# Patient Record
Sex: Male | Born: 1960 | Race: White | Hispanic: No | Marital: Single | Smoking: Never smoker
Health system: Southern US, Community
[De-identification: ages and names within clinical notes are randomized; demographics above are authoritative.]

## PROBLEM LIST (undated history)

## (undated) ENCOUNTER — Emergency Department (HOSPITAL_BASED_OUTPATIENT_CLINIC_OR_DEPARTMENT_OTHER): Payer: BC Managed Care – PPO

## (undated) DIAGNOSIS — N200 Calculus of kidney: Secondary | ICD-10-CM

## (undated) DIAGNOSIS — E119 Type 2 diabetes mellitus without complications: Secondary | ICD-10-CM

## (undated) DIAGNOSIS — E7800 Pure hypercholesterolemia, unspecified: Secondary | ICD-10-CM

## (undated) DIAGNOSIS — I10 Essential (primary) hypertension: Secondary | ICD-10-CM

## (undated) DIAGNOSIS — N19 Unspecified kidney failure: Secondary | ICD-10-CM

## (undated) HISTORY — PX: OTHER SURGICAL HISTORY: SHX169

---

## 2014-09-28 ENCOUNTER — Encounter (HOSPITAL_COMMUNITY): Payer: Self-pay | Admitting: Emergency Medicine

## 2014-09-28 ENCOUNTER — Emergency Department (HOSPITAL_COMMUNITY)
Admission: EM | Admit: 2014-09-28 | Discharge: 2014-09-28 | Disposition: A | Discharge status: Home / Self Care | Payer: BC Managed Care – PPO | Attending: Emergency Medicine | Admitting: Emergency Medicine

## 2014-09-28 DIAGNOSIS — I10 Essential (primary) hypertension: Secondary | ICD-10-CM | POA: Diagnosis not present

## 2014-09-28 DIAGNOSIS — Z87448 Personal history of other diseases of urinary system: Secondary | ICD-10-CM | POA: Insufficient documentation

## 2014-09-28 DIAGNOSIS — R197 Diarrhea, unspecified: Secondary | ICD-10-CM | POA: Insufficient documentation

## 2014-09-28 DIAGNOSIS — R112 Nausea with vomiting, unspecified: Secondary | ICD-10-CM | POA: Diagnosis present

## 2014-09-28 DIAGNOSIS — E119 Type 2 diabetes mellitus without complications: Secondary | ICD-10-CM | POA: Insufficient documentation

## 2014-09-28 DIAGNOSIS — R109 Unspecified abdominal pain: Secondary | ICD-10-CM | POA: Insufficient documentation

## 2014-09-28 DIAGNOSIS — Z87442 Personal history of urinary calculi: Secondary | ICD-10-CM | POA: Diagnosis not present

## 2014-09-28 HISTORY — DX: Calculus of kidney: N20.0

## 2014-09-28 HISTORY — DX: Essential (primary) hypertension: I10

## 2014-09-28 HISTORY — DX: Type 2 diabetes mellitus without complications: E11.9

## 2014-09-28 HISTORY — DX: Pure hypercholesterolemia, unspecified: E78.00

## 2014-09-28 HISTORY — DX: Unspecified kidney failure: N19

## 2014-09-28 LAB — CBC WITH DIFFERENTIAL/PLATELET
BASOS ABS: 0 10*3/uL (ref 0.0–0.1)
BASOS PCT: 0 % (ref 0–1)
Eosinophils Absolute: 0 10*3/uL (ref 0.0–0.7)
Eosinophils Relative: 0 % (ref 0–5)
HEMATOCRIT: 50.4 % (ref 39.0–52.0)
Hemoglobin: 17.6 g/dL — ABNORMAL HIGH (ref 13.0–17.0)
Lymphocytes Relative: 4 % — ABNORMAL LOW (ref 12–46)
Lymphs Abs: 0.6 10*3/uL — ABNORMAL LOW (ref 0.7–4.0)
MCH: 29.4 pg (ref 26.0–34.0)
MCHC: 34.9 g/dL (ref 30.0–36.0)
MCV: 84.1 fL (ref 78.0–100.0)
MONO ABS: 1.1 10*3/uL — AB (ref 0.1–1.0)
Monocytes Relative: 8 % (ref 3–12)
NEUTROS ABS: 12.4 10*3/uL — AB (ref 1.7–7.7)
NEUTROS PCT: 88 % — AB (ref 43–77)
PLATELETS: 175 10*3/uL (ref 150–400)
RBC: 5.99 MIL/uL — ABNORMAL HIGH (ref 4.22–5.81)
RDW: 13.6 % (ref 11.5–15.5)
WBC: 14.1 10*3/uL — ABNORMAL HIGH (ref 4.0–10.5)

## 2014-09-28 LAB — COMPREHENSIVE METABOLIC PANEL
ALK PHOS: 48 U/L (ref 38–126)
ALT: 48 U/L (ref 17–63)
AST: 34 U/L (ref 15–41)
Albumin: 4.5 g/dL (ref 3.5–5.0)
Anion gap: 14 (ref 5–15)
BILIRUBIN TOTAL: 1.4 mg/dL — AB (ref 0.3–1.2)
BUN: 35 mg/dL — ABNORMAL HIGH (ref 6–20)
CHLORIDE: 101 mmol/L (ref 101–111)
CO2: 20 mmol/L — AB (ref 22–32)
CREATININE: 2.25 mg/dL — AB (ref 0.61–1.24)
Calcium: 9.2 mg/dL (ref 8.9–10.3)
GFR calc Af Amer: 37 mL/min — ABNORMAL LOW (ref >60)
GFR, EST NON AFRICAN AMERICAN: 32 mL/min — AB (ref >60)
Glucose, Bld: 189 mg/dL — ABNORMAL HIGH (ref 65–99)
Potassium: 4.6 mmol/L (ref 3.5–5.1)
Sodium: 135 mmol/L (ref 135–145)
Total Protein: 7.4 g/dL (ref 6.5–8.1)

## 2014-09-28 LAB — LIPASE, BLOOD: Lipase: 31 U/L (ref 22–51)

## 2014-09-28 MED ORDER — DICYCLOMINE HCL 20 MG PO TABS: 20.0000 mg | ORAL_TABLET | Freq: Three times a day (TID) | ORAL | Status: DC

## 2014-09-28 MED ORDER — ONDANSETRON 4 MG PO TBDP: ORAL_TABLET | ORAL | Status: DC

## 2014-09-28 MED ORDER — DICYCLOMINE HCL 10 MG PO CAPS
10.0000 mg | ORAL_CAPSULE | ORAL | Status: AC
  Administered 2014-09-28: 10 mg via ORAL
  Filled 2014-09-28: qty 1

## 2014-09-28 NOTE — ED Provider Notes (Signed)
CSN: [id number]     Arrival date & time 09/28/14  1327 History   First MD Initiated Contact with Patient 09/28/14 1332     Chief Complaint  Patient presents with  . Nausea  . Emesis  . Diarrhea     (Consider location/radiation/quality/duration/timing/severity/associated sxs/prior Treatment) Patient is a 54 y.o. male presenting with vomiting and diarrhea. The history is provided by the patient.  Emesis Severity:  Mild Duration:  12 hours Timing:  Constant Quality:  Stomach contents Progression:  Unchanged Chronicity:  New Relieved by:  Nothing Worsened by:  Nothing tried Ineffective treatments:  None tried Associated symptoms: abdominal pain and diarrhea   Associated symptoms: no headaches   Abdominal pain:    Location:  Generalized   Quality:  Cramping   Severity:  Mild Diarrhea:    Quality:  Watery   Severity:  Mild   Duration:  12 hours   Timing:  Constant Diarrhea Associated symptoms: abdominal pain and vomiting   Associated symptoms: no fever and no headaches     Past Medical History  Diagnosis Date  . Diabetes mellitus without complication   . Kidney stones   . Kidney failure   . Hypertension   . High cholesterol    Past Surgical History  Procedure Laterality Date  . Kidney stone removal     No family history on file. History  Substance Use Topics  . Smoking status: Never Smoker   . Smokeless tobacco: Not on file  . Alcohol Use: No    Review of Systems  Constitutional: Negative for fever.  HENT: Negative for drooling and rhinorrhea.   Eyes: Negative for pain.  Respiratory: Negative for cough and shortness of breath.   Cardiovascular: Negative for chest pain and leg swelling.  Gastrointestinal: Positive for nausea, vomiting, abdominal pain and diarrhea.  Genitourinary: Negative for dysuria and hematuria.  Musculoskeletal: Negative for gait problem and neck pain.  Skin: Negative for color change.  Neurological: Negative for numbness and  headaches.  Hematological: Negative for adenopathy.  Psychiatric/Behavioral: Negative for behavioral problems.  All other systems reviewed and are negative.     Allergies  Review of patient's allergies indicates no known allergies.  Home Medications   Prior to Admission medications   Not on File   BP 128/80 mmHg  Pulse 103  Temp(Src) 98.6 F (37 C)  Resp 18  SpO2 95% Physical Exam  Constitutional: He is oriented to person, place, and time. He appears well-developed and well-nourished.  HENT:  Head: Normocephalic and atraumatic.  Right Ear: External ear normal.  Left Ear: External ear normal.  Nose: Nose normal.  Mouth/Throat: Oropharynx is clear and moist. No oropharyngeal exudate.  Eyes: Conjunctivae and EOM are normal. Pupils are equal, round, and reactive to light.  Neck: Normal range of motion. Neck supple.  Cardiovascular: Regular rhythm, normal heart sounds and intact distal pulses.  Exam reveals no gallop and no friction rub.   No murmur heard. Pulmonary/Chest: Effort normal and breath sounds normal. No respiratory distress. He has no wheezes.  Abdominal: Soft. Bowel sounds are normal. He exhibits no distension. There is no tenderness. There is no rebound and no guarding.  Musculoskeletal: Normal range of motion. He exhibits no edema or tenderness.  Neurological: He is alert and oriented to person, place, and time.  Skin: Skin is warm and dry.  Psychiatric: He has a normal mood and affect. His behavior is normal.  Nursing note and vitals reviewed.   ED Course  Procedures (including  critical care time) Labs Review Labs Reviewed  CBC WITH DIFFERENTIAL/PLATELET - Abnormal; Notable for the following:    WBC 14.1 (*)    RBC 5.99 (*)    Hemoglobin 17.6 (*)    Neutrophils Relative % 88 (*)    Neutro Abs 12.4 (*)    Lymphocytes Relative 4 (*)    Lymphs Abs 0.6 (*)    Monocytes Absolute 1.1 (*)    All other components within normal limits  COMPREHENSIVE  METABOLIC PANEL - Abnormal; Notable for the following:    CO2 20 (*)    Glucose, Bld 189 (*)    BUN 35 (*)    Creatinine, Ser 2.25 (*)    Total Bilirubin 1.4 (*)    GFR calc non Af Amer 32 (*)    GFR calc Af Amer 37 (*)    All other components within normal limits  LIPASE, BLOOD    Imaging Review No results found.   EKG Interpretation None      MDM   Final diagnoses:  Nausea and vomiting, vomiting of unspecified type  Diarrhea    2:02 PM 54 y.o. male  With history of diabetes and hypertension who presents with nausea, vomiting, diarrhea, and abdominal cramping which began around 9 PM last night which was 3 hours after [place] [employer] [place]. He denies any fevers or bloody emesis or stool. Mildly tachycardic here, vital signs otherwise unremarkable. He denies any abdominal pain currently. His abdomen is soft and benign. We'll get screening lab work and treat symptomatically. Likely foodborne illness.   Cr mildly elevated. Pt notes baseline to be between 1.5-2.0 usually. 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 Pt got 1L IVF here. He is making urine and continues to appear well. Will recommend sx therapy.  I have discussed the diagnosis/risks/treatment options with the patient and believe the pt to be eligible for discharge home to follow-up with his pcp as needed. We also discussed returning to the ED immediately if new or worsening sx occur. We discussed the sx which are most concerning (e.g., worsening abd pain, fever, bloody emesis/stools) that necessitate immediate return. Medications administered to the patient during their visit and any new prescriptions provided to the patient are listed below.  Medications given during this visit Medications  dicyclomine (BENTYL) capsule 10 mg (10 mg Oral Given 09/28/14 1533)    Discharge Medication List as of 09/28/2014  3:49 PM    START taking these medications   Details  dicyclomine (BENTYL) 20 MG tablet Take 1 tablet (20  mg total) by mouth 3 (three) times daily before meals., Starting 09/28/2014, Until Discontinued, Print    ondansetron (ZOFRAN ODT) 4 MG disintegrating tablet 4mg  ODT q4 hours prn nausea/vomit, Print           Purvis SheffieldForrest Demba Nigh, MD 09/29/14 1223

## 2014-09-28 NOTE — ED Notes (Signed)
Per [facility], pt [facility] transfer. [facility], three hours later n/v/d all throughout evening and this morning. Went to UC, felt somewhat better, was given 25mg  phenergan IM. Pt has intermittent abdominal pain, intermittent leg cramps which hes had for "years". Pt states he feels weak and SOB when he gets up and walk. RA saturations 98%.

## 2018-04-14 ENCOUNTER — Encounter: Payer: Self-pay | Admitting: Internal Medicine

## 2018-05-12 ENCOUNTER — Encounter: Payer: BC Managed Care – PPO | Attending: Internal Medicine | Admitting: Registered"

## 2018-05-12 ENCOUNTER — Encounter: Payer: Self-pay | Admitting: Registered"

## 2018-05-12 DIAGNOSIS — Z713 Dietary counseling and surveillance: Secondary | ICD-10-CM | POA: Diagnosis not present

## 2018-05-12 DIAGNOSIS — E781 Pure hyperglyceridemia: Secondary | ICD-10-CM | POA: Insufficient documentation

## 2018-05-12 DIAGNOSIS — E119 Type 2 diabetes mellitus without complications: Secondary | ICD-10-CM | POA: Insufficient documentation

## 2018-05-12 DIAGNOSIS — I10 Essential (primary) hypertension: Secondary | ICD-10-CM | POA: Insufficient documentation

## 2018-05-12 NOTE — Patient Instructions (Signed)
To find out what is causing low energy you may want to have thyroid checked.  Consider stopping the iron supplement and you can have iron levels if you and your MD think it may help. Consider taking a Vitamin B12 or B complex, due to metformin and may also help with energy, Instead of taking the vitamin C you may want to try the Emergen-C drop in that would also help you get more water in. Consider trying some of the sleep tips to get more quality sleep. Aim to eat balance meals and snacks, remember to include protein when eating fruit.

## 2018-05-12 NOTE — Progress Notes (Addendum)
Diabetes Self-Management Education  Visit Type: First/Initial  Appt. Start Time: 0930 Appt. End Time: 1045  05/12/2018  [name], identified by name and date of birth, is a 57 y.o. male with a diagnosis of Diabetes: Type 2.   ASSESSMENT Patient states about a year ago he made major changes to his diet in an attempt to lose weight. Pt states he cut out saturated fats and significantly reduced carbohydrates. Pt states for ~7 weeks he ate a lot of salads with grilled chicken and lost 20 lbs. Pt reports after getting burned out on eating the same thing he stopped being so strict and gained back the weight. Pt states his A1c went down to 5.2% and TG 400 (now he states TGs are 1200 and takes fenofibrate.) Pt states he tried fish oil for elevated TG, MD said the 3-week trial course didn't make enough difference to prescribe it, but go ahead and finish his OTC supply.  Pt states he would like to feel better, have more energy, be able to focus more and feel more productive. Pt states he started taking iron because he thought it might help him have more energy.   Pt states 4 yrs ago he started in a new position and it has been very hectic and stressful and is anticipating it will continue to increase in stress as his project continues to evolve. Pt states his work is sedentary and he does not have time to exercise. 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   Pt states he doesn't feel well and his BM have been adversely effected by the low carb diet and wants to know what types of carbs he should be eating. Pt states he continues to have urgency after eating lunch. Patient states he has discussed symptoms with MD as well.  Pt states uric acids levels are high and he has history of kidney stones and he was told R kidney is at 10% function. Pt states he feels he is probably dehydrated.   Pt states he gets abdominal cramps and occasionally in the jaw as well which usually occur during the day. Pt states  he also experiences excessive sweating when.  Sleep: 6 hrs uses CPAP. Pt states he does not have energy in the morning, never been a morning person, energy increases during the day and then low again in the evening. . Energy: 5/10. caffeine at breakfast and lunch, avoids after 6 pm. Pt states when younger his peak energy time was 12-2 am Stress: 8/10 Pt reports he does not have any specific thing he does for stress management ("I need to exercise more") sometimes will play video games with nephew,which he feels helps.  Provided sample fish oil supplement TherOmega Lot [id number] Exp 09/2018  Diabetes Self-Management Education - 05/12/18 0959      Visit Information   Visit Type  First/Initial      Initial Visit   Diabetes Type  Type 2    Are you currently following a meal plan?  Yes    What type of meal plan do you follow?  low carb    Are you taking your medications as prescribed?  Yes    Date Diagnosed  10 yr ago      Health Coping   How would you rate your overall health?  Fair      Psychosocial Assessment   Patient Belief/Attitude about Diabetes  Motivated to manage diabetes    How often do you need to have  someone help you when you read instructions, pamphlets, or other written materials from your doctor or pharmacy?  1 - Never    What is the last grade level you completed in school?  masters degree      Complications   Last HgB A1C per patient/outside source  6.3 %    How often do you check your blood sugar?  1-2 times/day    Fasting Blood glucose range (mg/dL)  16-109;[id number];130-179   [id number]   Number of hypoglycemic episodes per month  0    Have you had a dilated eye exam in the past 12 months?  Yes    Have you had a dental exam in the past 12 months?  Yes    Are you checking your feet?  Yes    How many days per week are you checking your feet?  1      Dietary Intake   Breakfast  pork sausage egg cheese, unsweet tea    Snack (morning)  none    Lunch  caferteria vegetables,  fish or chicken OR burger (no bread), unsweet tea    Snack (afternoon)  none OR grapes OR leftover protein    Dinner  protein, sometimes vegetables, rarely potatoes    Snack (evening)  protein (nuts eaten daily) OR fruit    Beverage(s)  unsweet tea, caffiene free diet mtn dew, gatorade zero, water      Exercise   Exercise Type  ADL's    How many days per week to you exercise?  0    How many minutes per day do you exercise?  0    Total minutes per week of exercise  0      Patient Education   Previous Diabetes Education  No    Nutrition management   Role of diet in the treatment of diabetes and the relationship between the three main macronutrients and blood glucose level    Physical activity and exercise   Role of exercise on diabetes management, blood pressure control and cardiac health.    Psychosocial adjustment  Role of stress on diabetes    Personal strategies to promote health  Other (comment)   sleep     Individualized Goals (developed by patient)   Nutrition  General guidelines for healthy choices and portions discussed      Outcomes   Expected Outcomes  Demonstrated interest in learning. Expect positive outcomes    Future DMSE  PRN    Program Status  Completed      Individualized Plan for Diabetes Self-Management Training:   Learning Objective:  Patient will have a greater understanding of diabetes self-management. Patient education plan is to attend individual and/or group sessions per assessed needs and concerns.    Patient Instructions  To find out what is causing low energy you may want to have thyroid checked.  Consider stopping the iron supplement and you can have iron levels if you and your MD think it may help. Consider taking a Vitamin B12 or B complex, due to metformin and may also help with energy, Instead of taking the vitamin C you may want to try the Emergen-C drop in that would also help you get more water in. Consider trying some of the sleep tips to  get more quality sleep. Aim to eat balance meals and snacks, remember to include protein when eating fruit.   Expected Outcomes:  Demonstrated interest in learning. Expect positive outcomes  Education material provided: My Plate, sleep hygiene, Food Group  ideas handouts.  If problems or questions, patient to contact team via:  Phone  Future DSME appointment: PRN

## 2018-08-08 ENCOUNTER — Encounter: Payer: Self-pay | Admitting: Internal Medicine

## 2018-08-12 ENCOUNTER — Telehealth: Payer: Self-pay | Admitting: Internal Medicine

## 2018-08-12 NOTE — Telephone Encounter (Signed)
Called patient and left VM to call and schedule lipid appointment with Dr. Rennis Golden.

## 2019-01-01 ENCOUNTER — Other Ambulatory Visit: Payer: Self-pay

## 2019-01-01 ENCOUNTER — Encounter: Payer: Self-pay | Admitting: Internal Medicine

## 2019-01-01 ENCOUNTER — Ambulatory Visit (INDEPENDENT_AMBULATORY_CARE_PROVIDER_SITE_OTHER): Payer: BC Managed Care – PPO | Admitting: Internal Medicine

## 2019-01-01 VITALS — BP 150/80 | HR 84 | Temp 99.5°F | Ht 74.0 in | Wt 315.8 lb

## 2019-01-01 DIAGNOSIS — E781 Pure hyperglyceridemia: Secondary | ICD-10-CM

## 2019-01-01 DIAGNOSIS — Z8249 Family history of ischemic heart disease and other diseases of the circulatory system: Secondary | ICD-10-CM | POA: Diagnosis not present

## 2019-01-01 DIAGNOSIS — E119 Type 2 diabetes mellitus without complications: Secondary | ICD-10-CM

## 2019-01-01 DIAGNOSIS — I10 Essential (primary) hypertension: Secondary | ICD-10-CM

## 2019-01-01 MED ORDER — FENOFIBRATE MICRONIZED 134 MG PO CAPS: ORAL_CAPSULE | ORAL | 11 refills | Status: DC

## 2019-01-01 MED ORDER — VASCEPA 1 G PO CAPS: 2.0000 g | ORAL_CAPSULE | Freq: Two times a day (BID) | ORAL | 11 refills | Status: DC

## 2019-01-01 NOTE — Patient Instructions (Signed)
Medication Instructions:  START vascepa 2 gram twice daily  START fenofibrate 134mg  once daily with a meal If you need a refill on your cardiac medications before your next appointment, please call your pharmacy.   Lab work: FASTING lab work in about 3 months (prior to next visit with Dr. Debara Pickett) If you have labs (blood work) drawn today and your tests are completely normal, you will receive your results only by: Marland Kitchen MyChart Message (if you have MyChart) OR . A paper copy in the mail If you have any lab test that is abnormal or we need to change your treatment, we will call you to review the results.  Testing/Procedures: Dr. Debara Pickett has ordered a CT coronary calcium score. This test is done at [address]. This is $150 out of pocket.   Coronary CalciumScan A coronary calcium scan is an imaging test used to look for deposits of calcium and other fatty materials (plaques) in the inner lining of the blood vessels of the heart (coronary arteries). These deposits of calcium and plaques can partly clog and narrow the coronary arteries without producing any symptoms or warning signs. This puts a person at risk for a heart attack. This test can detect these deposits before symptoms develop. Tell a health care provider about:  Any allergies you have.  All medicines you are taking, including vitamins, herbs, eye drops, creams, and over-the-counter medicines.  Any problems you or family members have had with anesthetic medicines.  Any blood disorders you have.  Any surgeries you have had.  Any medical conditions you have.  Whether you are pregnant or may be pregnant. What are the risks? Generally, this is a safe procedure. However, problems may occur, including:  Harm to a pregnant woman and her unborn baby. This test involves the use of radiation. Radiation exposure can be dangerous to a pregnant woman and her unborn baby. If you are pregnant, you generally should not have  this procedure done.  Slight increase in the risk of cancer. This is because of the radiation involved in the test. What happens before the procedure? No preparation is needed for this procedure. What happens during the procedure?  You will undress and remove any jewelry around your neck or chest.  You will put on a hospital gown.  Sticky electrodes will be placed on your chest. The electrodes will be connected to an electrocardiogram (ECG) machine to record a tracing of the electrical activity of your heart.  A CT scanner will take pictures of your heart. During this time, you will be asked to lie still and hold your breath for 2-3 seconds while a picture of your heart is being taken. The procedure may vary among health care providers and hospitals. What happens after the procedure?  You can get dressed.  You can return to your normal activities.  It is up to you to get the results of your test. Ask your health care provider, or the department that is doing the test, when your results will be ready. Summary  A coronary calcium scan is an imaging test used to look for deposits of calcium and other fatty materials (plaques) in the inner lining of the blood vessels of the heart (coronary arteries).  Generally, this is a safe procedure. Tell your health care provider if you are pregnant or may be pregnant.  No preparation is needed for this procedure.  A CT scanner will take pictures of your heart.  You can return  to your normal activities after the scan is done. This information is not intended to replace advice given to you by your health care provider. Make sure you discuss any questions you have with your health care provider. Document Released: 10/27/2007 Document Revised: 03/19/2016 Document Reviewed: 03/19/2016 Elsevier Interactive Patient Education  2017 Elsevier Inc.    Follow-Up: Dr. Rennis GoldenHilty recommends that you schedule a follow up visit with him the in the LIPID CLINIC  in 3 months. Please have fasting blood work about 1 week prior to this visit and he will review the blood work results with you at your appointment.   [email or web address] co-pay card [email or web address]

## 2019-01-01 NOTE — Progress Notes (Addendum)
LIPID CLINIC CONSULT NOTE  Chief Complaint:  High triglycerides  Primary Care Physician: Alysia PennaHolwerda, Scott, Jesse Hall  Primary Hall:  No primary care provider on file.  HPI:  Jesse Hall is a 58 y.o. male who is being seen today for the evaluation of high triglycerides at the request of Alysia PennaHolwerda, Scott, Jesse Hall.  This is a pleasant 58 year old male who is referred by Dr. Link SnufferHolwerda for high triglycerides.  He has past medical history significant for diabetes however this is only in the past several years.  He has otherwise a longstanding history of high triglycerides for more than 20 years.  This complicated by obesity, hypertension, some degree of kidney dysfunction with kidney stones, OSA on CPAP, and a family history of heart disease on his mother side.  Most recently his lipid profile showed a total cholesterol 170 with triglycerides of 1001 and HDL 25.  LDL was not calculated.  Previously was taking fenofibrate 145 mg daily however has been off of it for few weeks as he says it causes him some cramping and unusual side effects.  He had also been briefly trialed on Vascepa in the past.  According to him he took it for a week and did not have significant improvement in his numbers and therefore it was stopped, however it may have just been samples.  Is not clear to me whether he had a significant amount of time on the medication to determine a benefit.  Symptomatically he reports being very fatigued.  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  When he gets some rest and sleep he seems to do better.  He denies any overt chest pain.  PMHx:  Past Medical History:  Diagnosis Date  . Diabetes mellitus without complication (HCC)   . High cholesterol   . Hypertension   . Kidney failure   . Kidney stones     Past Surgical History:  Procedure Laterality Date  . kidney stone removal      FAMHx:  Family  History  Problem Relation Age of Onset  . CAD Mother     SOCHx:   reports that he has never smoked. He does not have any smokeless tobacco history on file. He reports that he does not drink alcohol. No history on file for drug.  ALLERGIES:  No Known Allergies  ROS: Pertinent items noted in HPI and remainder of comprehensive ROS otherwise negative.  HOME MEDS: Current Outpatient Medications on File Prior to Visit  Medication Sig Dispense Refill  . allopurinol (ZYLOPRIM) 300 MG tablet Take 300 mg by mouth daily.    . Ascorbic Acid (VITAMIN C) 100 MG tablet Take 100 mg by mouth daily.    Marland Kitchen. aspirin EC 81 MG tablet Take 81 mg by mouth daily.    . Iron TABS Take 1 tablet by mouth daily.    Marland Kitchen. losartan (COZAAR) 100 MG tablet Take 100 mg by mouth daily.    . metFORMIN (GLUCOPHAGE) 500 MG tablet Take 1,000 mg by mouth 2 (two) times daily with a meal.    . Multiple Vitamin (MULTIVITAMIN WITH MINERALS) TABS tablet Take 1 tablet by mouth daily.    . ondansetron (ZOFRAN ODT) 4 MG disintegrating tablet 4mg  ODT q4 hours prn nausea/vomit 15 tablet 0  . potassium citrate (UROCIT-K) 10 MEQ (1080 MG) SR tablet Take 10 mEq by mouth 2 (two) times daily.    .Marland Kitchen  sitaGLIPtin (JANUVIA) 100 MG tablet Take 100 mg by mouth daily.     No current facility-administered medications on file prior to visit.     LABS/IMAGING: No results found for this or any previous visit (from the past 48 hour(s)). No results found.  LIPID PANEL: No results found for: CHOL, TRIG, HDL, CHOLHDL, VLDL, LDLCALC, LDLDIRECT  WEIGHTS: Wt Readings from Last 3 Encounters:  01/01/19 (!) 315 lb 12.8 oz (143.2 kg)    VITALS: BP (!) 150/80   Pulse 84   Temp 99.5 F (37.5 C) (Temporal)   Ht 6\' 2"  (1.88 m)   Wt (!) 315 lb 12.8 oz (143.2 kg)   BMI 40.55 kg/m   EXAM: General appearance: alert, no distress and morbidly obese Neck: no carotid bruit, no JVD and thyroid not enlarged, symmetric, no tenderness/mass/nodules Lungs: clear  to auscultation bilaterally Heart: regular rate and rhythm, S1, S2 normal, no murmur, click, rub or gallop Abdomen: soft, non-tender; bowel sounds normal; no masses,  no organomegaly Extremities: extremities normal, atraumatic, no cyanosis or edema Pulses: 2+ and symmetric Skin: Skin color, texture, turgor normal. No rashes or lesions Neurologic: Grossly normal Psych: Pleasant  EKG: Deferred  ASSESSMENT: 1. Primary hypertriglyceridemia 2. Morbid obesity 3. Type 2 diabetes  PLAN: 1.   Jesse Hall has primary hypertriglyceridemia with triglycerides over thousand and is at potentially increased risk for pancreatitis.  He has had some side effects on fenofibrate.  He has been off of it for a few weeks because he noted that it had caused him some cramps and when he discontinues the medicine he feels better.  He had briefly tried Vascepa however does not sound like he either had benefit or tried it long enough.  The details are not totally clear to me however I think this is a good option for him.  I like for him to reconsider starting Vascepa 2 g twice daily.  In addition we will switch him to micronized fenofibrate 134 mg daily which may be better tolerated and is advised to take it with food.  We will plan to repeat lipids in about 3 to 4 months and follow-up afterwards.  In addition we will obtain a direct LDL.  If he is above target he would likely benefit from addition of a statin as well.  Thanks again for the kind referral.  Jesse Casino, Jesse Hall, Jesse Hall, Jesse Hall  Direct Dial: [phone]  Fax: [phone]  Website:  [email or web address]  Jesse Hall 01/01/2019, 1:33 PM

## 2019-01-30 ENCOUNTER — Other Ambulatory Visit: Payer: Self-pay

## 2019-01-30 ENCOUNTER — Ambulatory Visit (INDEPENDENT_AMBULATORY_CARE_PROVIDER_SITE_OTHER)
Admission: RE | Admit: 2019-01-30 | Discharge: 2019-01-30 | Disposition: A | Discharge status: Home / Self Care | Payer: BC Managed Care – PPO | Source: Ambulatory Visit | Attending: Internal Medicine | Admitting: Internal Medicine

## 2019-01-30 DIAGNOSIS — Z8249 Family history of ischemic heart disease and other diseases of the circulatory system: Secondary | ICD-10-CM

## 2019-01-30 DIAGNOSIS — E781 Pure hyperglyceridemia: Secondary | ICD-10-CM

## 2019-01-30 DIAGNOSIS — I10 Essential (primary) hypertension: Secondary | ICD-10-CM

## 2019-02-06 ENCOUNTER — Other Ambulatory Visit: Payer: Self-pay | Admitting: Internal Medicine

## 2019-02-06 MED ORDER — FENOFIBRATE MICRONIZED 134 MG PO CAPS: ORAL_CAPSULE | ORAL | 3 refills | Status: DC

## 2019-02-06 MED ORDER — VASCEPA 1 G PO CAPS: 2.0000 g | ORAL_CAPSULE | Freq: Two times a day (BID) | ORAL | 3 refills | Status: DC

## 2019-02-09 ENCOUNTER — Telehealth: Payer: Self-pay

## 2019-02-09 NOTE — Telephone Encounter (Signed)
Prior Auth started for Vascepa 1 GM Cap

## 2019-02-09 NOTE — Telephone Encounter (Signed)
[facility]  received a request from your provider for coverage of Vascepa 1GM OR CAPS. As long as you remain covered by the [facility] and there are no changes to your plan benefits, this request is approved for the following time period: 02/09/2019 - 02/08/2022

## 2019-02-11 ENCOUNTER — Telehealth: Payer: Self-pay | Admitting: Internal Medicine

## 2019-02-11 NOTE — Telephone Encounter (Signed)
Discussed Fenofibrate with patient and per dictation Dr Debara Pickett was aware of his intolerance previously. Patient will start as suggested by Dr Debara Pickett

## 2019-02-11 NOTE — Telephone Encounter (Signed)
New Message  Pt c/o medication issue:  1. Name of Medication: fenofibrate micronized (LOFIBRA) 134 MG capsule  2. How are you currently taking this medication (dosage and times per day)? Take 1 capsule by mouth once daily with a meal  3. Are you having a reaction (difficulty breathing--STAT)? No  4. What is your medication issue? Patient would like to clarify whether he was supposed to have refills for this medication called in or whether he was supposed to be taking a different medication. Please give patient a call back to confirm.

## 2019-03-20 ENCOUNTER — Telehealth: Payer: Self-pay | Admitting: Internal Medicine

## 2019-03-20 NOTE — Telephone Encounter (Signed)
LM with appt & lab reminder  OV 03/25/2019 @ 0830

## 2019-03-24 LAB — LIPID PANEL
Chol/HDL Ratio: 6.1 ratio — ABNORMAL HIGH (ref 0.0–5.0)
Cholesterol, Total: 164 mg/dL (ref 100–199)
HDL: 27 mg/dL — ABNORMAL LOW (ref >39)
LDL Chol Calc (NIH): 60 mg/dL (ref 0–99)
Triglycerides: 505 mg/dL — ABNORMAL HIGH (ref 0–149)
VLDL Cholesterol Cal: 77 mg/dL — ABNORMAL HIGH (ref 5–40)

## 2019-03-24 LAB — LDL CHOLESTEROL, DIRECT: LDL Direct: 58 mg/dL (ref 0–99)

## 2019-03-25 ENCOUNTER — Ambulatory Visit (INDEPENDENT_AMBULATORY_CARE_PROVIDER_SITE_OTHER): Payer: BC Managed Care – PPO | Admitting: Internal Medicine

## 2019-03-25 ENCOUNTER — Encounter: Payer: Self-pay | Admitting: Internal Medicine

## 2019-03-25 ENCOUNTER — Other Ambulatory Visit: Payer: Self-pay

## 2019-03-25 VITALS — BP 145/75 | HR 82 | Ht 74.0 in | Wt 312.8 lb

## 2019-03-25 DIAGNOSIS — E119 Type 2 diabetes mellitus without complications: Secondary | ICD-10-CM

## 2019-03-25 DIAGNOSIS — Z8249 Family history of ischemic heart disease and other diseases of the circulatory system: Secondary | ICD-10-CM | POA: Diagnosis not present

## 2019-03-25 DIAGNOSIS — R931 Abnormal findings on diagnostic imaging of heart and coronary circulation: Secondary | ICD-10-CM | POA: Diagnosis not present

## 2019-03-25 DIAGNOSIS — E781 Pure hyperglyceridemia: Secondary | ICD-10-CM

## 2019-03-25 NOTE — Progress Notes (Signed)
LIPID CLINIC CONSULT NOTE  Chief Complaint:  Follow-up elevated triglycerides  Primary Care Physician: Alysia PennaHolwerda, Scott, MD  Primary Cardiologist:  No primary care provider on file.  HPI:  Jesse Hall is a 58 y.o. male who is being seen today for the evaluation of high triglycerides at the request of Alysia PennaHolwerda, Scott, MD.  This is a pleasant 58 year old male who is referred by Dr. Link SnufferHolwerda for high triglycerides.  He has past medical history significant for diabetes however this is only in the past several years.  He has otherwise a longstanding history of high triglycerides for more than 20 years.  This complicated by obesity, hypertension, some degree of kidney dysfunction with kidney stones, OSA on CPAP, and a family history of heart disease on his mother side.  Most recently his lipid profile showed a total cholesterol 170 with triglycerides of 1001 and HDL 25.  LDL was not calculated.  Previously was taking fenofibrate 145 mg daily however has been off of it for few weeks as he says it causes him some cramping and unusual side effects.  He had also been briefly trialed on Vascepa in the past.  According to him he took it for a week and did not have significant improvement in his numbers and therefore it was stopped, however it may have just been samples.  Is not clear to me whether he had a significant amount of time on the medication to determine a benefit.  Symptomatically he reports being very fatigued.  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  When he gets some rest and sleep he seems to do better.  He denies any overt chest pain.  03/25/2019  Jesse Hall returns today for follow-up of his elevated triglycerides.  I am pleased to report a significant improvement in his numbers.  His total cholesterol today is now 164, triglycerides are 505 (reduced from 1001), HDL 27 and LDL is 60.  He did undergo  coronary artery calcium scoring which was 100, this is 75th percentile based on age and sex matched controls.  Aggressive therapy is recommended given his history of diabetes as well.  His target LDL is less than 70 which she is achieved and he has had significant reduction in his triglycerides more than 50% after adjusting his medications.  He seems to be tolerating them well additionally.  He started to exercise a little more regularly walking at least a couple miles at a fast pace several days a week.  He does report he continues to be very stressed at his job.  PMHx:  Past Medical History:  Diagnosis Date  . Diabetes mellitus without complication (HCC)   . High cholesterol   . Hypertension   . Kidney failure   . Kidney stones     Past Surgical History:  Procedure Laterality Date  . kidney stone removal      FAMHx:  Family History  Problem Relation Age of Onset  . CAD Mother     SOCHx:   reports that he has never smoked. He has never used smokeless tobacco. He reports that he does not drink alcohol. No history on file for drug.  ALLERGIES:  No Known Allergies  ROS: Pertinent items noted in HPI and remainder of comprehensive ROS otherwise negative.  HOME MEDS: Current Outpatient Medications on File Prior to Visit  Medication Sig Dispense Refill  . allopurinol (  ZYLOPRIM) 300 MG tablet Take 300 mg by mouth daily.    . Ascorbic Acid (VITAMIN C) 100 MG tablet Take 100 mg by mouth daily.    Marland Kitchen aspirin EC 81 MG tablet Take 81 mg by mouth daily.    . fenofibrate micronized (LOFIBRA) 134 MG capsule Take 1 capsule by mouth once daily with a meal 90 capsule 3  . Icosapent Ethyl (VASCEPA) 1 g CAPS Take 2 capsules (2 g total) by mouth 2 (two) times daily. 360 capsule 3  . Iron TABS Take 1 tablet by mouth daily.    Marland Kitchen losartan (COZAAR) 100 MG tablet Take 100 mg by mouth daily.    . metFORMIN (GLUCOPHAGE) 500 MG tablet Take 1,000 mg by mouth 2 (two) times daily with a meal.    . Multiple  Vitamin (MULTIVITAMIN WITH MINERALS) TABS tablet Take 1 tablet by mouth daily.    . potassium citrate (UROCIT-K) 10 MEQ (1080 MG) SR tablet Take 10 mEq by mouth 2 (two) times daily.    . sitaGLIPtin (JANUVIA) 100 MG tablet Take 100 mg by mouth daily.     No current facility-administered medications on file prior to visit.     LABS/IMAGING: Results for orders placed or performed in visit on 01/01/19 (from the past 48 hour(s))  Lipid panel     Status: Abnormal   Collection Time: 03/24/19  9:08 AM  Result Value Ref Range   Cholesterol, Total 164 100 - 199 mg/dL   Triglycerides 973 (H) 0 - 149 mg/dL   HDL 27 (L) >53 mg/dL   VLDL Cholesterol Cal 77 (H) 5 - 40 mg/dL   LDL Chol Calc (NIH) 60 0 - 99 mg/dL   Chol/HDL Ratio 6.1 (H) 0.0 - 5.0 ratio    Comment:                                   T. Chol/HDL Ratio                                             Men  Women                               1/2 Avg.Risk  3.4    3.3                                   Avg.Risk  5.0    4.4                                2X Avg.Risk  9.6    7.1                                3X Avg.Risk 23.4   11.0   LDL cholesterol, direct     Status: None   Collection Time: 03/24/19  9:08 AM  Result Value Ref Range   LDL Direct 58 0 - 99 mg/dL   No results found.  LIPID PANEL:    Component Value Date/Time   CHOL 164 03/24/2019 0908   TRIG  505 (H) 03/24/2019 0908   HDL 27 (L) 03/24/2019 0908   CHOLHDL 6.1 (H) 03/24/2019 0908   LDLCALC 60 03/24/2019 0908   LDLDIRECT 58 03/24/2019 0908    WEIGHTS: Wt Readings from Last 3 Encounters:  03/25/19 (!) 312 lb 12.8 oz (141.9 kg)  01/01/19 (!) 315 lb 12.8 oz (143.2 kg)    VITALS: BP (!) 145/75   Pulse 82   Ht 6\' 2"  (1.88 m)   Wt (!) 312 lb 12.8 oz (141.9 kg)   SpO2 94%   BMI 40.16 kg/m   EXAM: Deferred  EKG: Deferred  ASSESSMENT: 1. Primary hypertriglyceridemia 2. Elevated CAC score of 100 3. Morbid obesity 4. Type 2 diabetes   PLAN: 1.   Jesse Hall  has had significant improvement in his triglycerides.  He does have some evidence of early onset coronary disease with an elevated calcium score 100 (75th percentile for age and sex matched controls).  Aggressive therapy of his diabetes is well as recommended.  He continues to work on weight loss and more exercise.  No changes made to his therapy today.  We will plan follow-up in 6 months or sooner as necessary.  Pixie Casino, MD, [facility], [place] Director of the Advanced Lipid Disorders &  Cardiovascular Risk Reduction Clinic Diplomate of the American Board of Clinical Lipidology Attending Cardiologist  Direct Dial: [phone]  Fax: [phone]  Website:  [email or web address] Celestino Ackerman 03/25/2019, 1:03 PM

## 2019-03-25 NOTE — Patient Instructions (Addendum)
Medication Instructions:  Your physician recommends that you continue on your current medications as directed. Please refer to the Current Medication list given to you today.  *If you need a refill on your cardiac medications before your next appointment, please call your pharmacy*  Lab Work: FASTING lab work before next appointment  If you have labs (blood work) drawn today and your tests are completely normal, you will receive your results only by: Marland Kitchen MyChart Message (if you have MyChart) OR . A paper copy in the mail If you have any lab test that is abnormal or we need to change your treatment, we will call you to review the results.  Testing/Procedures: NONE  Follow-Up:  Dr. Debara Pickett recommends that you schedule a follow up visit with him the in the [place] in 6 months. Please have fasting blood work about 1 week prior to this visit and he will review the blood work results with you at your appointment.

## 2019-07-06 ENCOUNTER — Ambulatory Visit: Payer: BC Managed Care – PPO | Attending: Family

## 2019-07-06 DIAGNOSIS — Z23 Encounter for immunization: Secondary | ICD-10-CM | POA: Insufficient documentation

## 2019-07-06 NOTE — Progress Notes (Signed)
   Covid-19 Vaccination Clinic  Name:  Jesse Hall    MRN: [id number] DOB: 1960-08-13  07/06/2019  Jesse Hall was observed post Covid-19 immunization for 15 minutes without incidence. He was provided with Vaccine Information Sheet and instruction to access the V-Safe system.   Jesse Hall was instructed to call 911 with any severe reactions post vaccine: Marland Kitchen Difficulty breathing  . Swelling of your face and throat  . A fast heartbeat  . A bad rash all over your body  . Dizziness and weakness    Immunizations Administered    Name Date Dose VIS Date Route   Moderna COVID-19 Vaccine 07/06/2019 12:01 PM 0.5 mL 04/14/2019 Intramuscular   Manufacturer: Moderna   Lot: [id number]   NDC: 29021-115-52

## 2019-08-04 ENCOUNTER — Ambulatory Visit: Payer: BC Managed Care – PPO | Attending: Family

## 2019-08-04 DIAGNOSIS — Z23 Encounter for immunization: Secondary | ICD-10-CM

## 2019-08-04 NOTE — Progress Notes (Signed)
   Covid-19 Vaccination Clinic  Name:  Jesse Hall    MRN: [id number] DOB: [birth year]  08/04/2019  [name] was observed post Covid-19 immunization for 15 minutes without incident. He was provided with Vaccine Information Sheet and instruction to access the V-Safe system.   [name] was instructed to call 911 with any severe reactions post vaccine: Marland Kitchen Difficulty breathing  . Swelling of face and throat  . A fast heartbeat  . A bad rash all over body  . Dizziness and weakness   Immunizations Administered    Name Date Dose VIS Date Route   Moderna COVID-19 Vaccine 08/04/2019 11:33 AM 0.5 mL 04/14/2019 Intramuscular   Manufacturer: Moderna   Lot: [id number]   NDC: 61470-929-57

## 2019-09-30 ENCOUNTER — Telehealth: Payer: BC Managed Care – PPO | Admitting: Physician Assistant

## 2019-11-30 LAB — LIPID PANEL
Chol/HDL Ratio: 8 ratio — ABNORMAL HIGH (ref 0.0–5.0)
Cholesterol, Total: 177 mg/dL (ref 100–199)
HDL: 22 mg/dL — ABNORMAL LOW (ref >39)
Triglycerides: 998 mg/dL (ref 0–149)

## 2019-11-30 LAB — LDL CHOLESTEROL, DIRECT: LDL Direct: 38 mg/dL (ref 0–99)

## 2019-12-02 ENCOUNTER — Ambulatory Visit: Payer: BC Managed Care – PPO | Admitting: Internal Medicine

## 2019-12-02 ENCOUNTER — Other Ambulatory Visit: Payer: Self-pay

## 2019-12-02 ENCOUNTER — Encounter: Payer: Self-pay | Admitting: Internal Medicine

## 2019-12-02 VITALS — BP 120/64 | HR 83 | Ht 74.0 in | Wt 302.4 lb

## 2019-12-02 DIAGNOSIS — E781 Pure hyperglyceridemia: Secondary | ICD-10-CM | POA: Diagnosis not present

## 2019-12-02 DIAGNOSIS — E119 Type 2 diabetes mellitus without complications: Secondary | ICD-10-CM | POA: Diagnosis not present

## 2019-12-02 DIAGNOSIS — R931 Abnormal findings on diagnostic imaging of heart and coronary circulation: Secondary | ICD-10-CM

## 2019-12-02 DIAGNOSIS — Z8249 Family history of ischemic heart disease and other diseases of the circulatory system: Secondary | ICD-10-CM

## 2019-12-02 NOTE — Patient Instructions (Signed)
Medication Instructions:  Your physician recommends that you continue on your current medications as directed. Please refer to the Current Medication list given to you today.  *If you need a refill on your cardiac medications before your next appointment, please call your pharmacy*   Lab Work: FASTING lipid panel in 6 months (before next appointment with Dr. Rennis Golden)  If you have labs (blood work) drawn today and your tests are completely normal, you will receive your results only by: Marland Kitchen MyChart Message (if you have MyChart) OR . A paper copy in the mail If you have any lab test that is abnormal or we need to change your treatment, we will call you to review the results.   Testing/Procedures: NONE   Follow-Up: At [facility], you and your health needs are our priority.  As part of our continuing mission to provide you with exceptional heart care, we have created designated Provider Care Teams.  These Care Teams include your primary Cardiologist (physician) and Advanced Practice Providers (APPs -  Physician Assistants and Nurse Practitioners) who all work together to provide you with the care you need, when you need it.  We recommend signing up for the patient portal called "MyChart".  Sign up information is provided on this After Visit Summary.  MyChart is used to connect with patients for Virtual Visits (Telemedicine).  Patients are able to view lab/test results, encounter notes, upcoming appointments, etc.  Non-urgent messages can be sent to your provider as well.   To learn more about what you can do with MyChart, go to [email or web address].    Your next appointment:   6 month(s) - lipid clinic  The format for your next appointment:   In Person  Provider:   K. Italy Hilty, MD   Other Instructions    Beta Sitosterolemia

## 2019-12-02 NOTE — Progress Notes (Signed)
LIPID CLINIC CONSULT NOTE  Chief Complaint:  Follow-up elevated triglycerides  Primary Care Physician: Jesse Penna, MD  Primary Cardiologist:  No primary care provider on file.  HPI:  Jesse Hall is a 59 y.o. male who is being seen today for the evaluation of high triglycerides at the request of Jesse Penna, MD.  This is a pleasant 59 year old male who is referred by Dr. Link Hall for high triglycerides.  He has past medical history significant for diabetes however this is only in the past several years.  He has otherwise a longstanding history of high triglycerides for more than 20 years.  This complicated by obesity, hypertension, some degree of kidney dysfunction with kidney stones, OSA on CPAP, and a family history of heart disease on his mother side.  Most recently his lipid profile showed a total cholesterol 170 with triglycerides of 1001 and HDL 25.  LDL was not calculated.  Previously was taking fenofibrate 145 mg daily however has been off of it for few weeks as he says it causes him some cramping and unusual side effects.  He had also been briefly trialed on Vascepa in the past.  According to him he took it for a week and did not have significant improvement in his numbers and therefore it was stopped, however it may have just been samples.  Is not clear to me whether he had a significant amount of time on the medication to determine a benefit.  Symptomatically he reports being very fatigued.  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  When he gets some rest and sleep he seems to do better.  He denies any overt chest pain.  03/25/2019  Jesse Hall returns today for follow-up of his elevated triglycerides.  I am pleased to report a significant improvement in his numbers.  His total cholesterol today is now 164, triglycerides are 505 (reduced from 1001), HDL 27 and LDL is 60.  He did undergo  coronary artery calcium scoring which was 100, this is 75th percentile based on age and sex matched controls.  Aggressive therapy is recommended given his history of diabetes as well.  His target LDL is less than 70 which she is achieved and he has had significant reduction in his triglycerides more than 50% after adjusting his medications.  He seems to be tolerating them well additionally.  He started to exercise a little more regularly walking at least a couple miles at a fast pace several days a week.  He does report he continues to be very stressed at his job.  12/02/2019  Jesse Hall is seen today in follow-up.  He had an initial excellent response to Vascepa and fenofibrate with improvement in triglycerides down to 505 however recent repeat lab work shows an increase in triglycerides now up to almost 1000 (998).  He says he is actually change his diet more significantly, reducing carbohydrates and lost 7 pounds.  Daily fasting blood sugars are better as well.  PMHx:  Past Medical History:  Diagnosis Date  . Diabetes mellitus without complication (HCC)   . High cholesterol   . Hypertension   . Kidney failure   . Kidney stones     Past Surgical History:  Procedure Laterality Date  . kidney stone removal      FAMHx:  Family History  Problem Relation Age of Onset  . CAD Mother  SOCHx:   reports that he has never smoked. He has never used smokeless tobacco. He reports that he does not drink alcohol. No history on file for drug use.  ALLERGIES:  No Known Allergies  ROS: Pertinent items noted in HPI and remainder of comprehensive ROS otherwise negative.  HOME MEDS: Current Outpatient Medications on File Prior to Visit  Medication Sig Dispense Refill  . allopurinol (ZYLOPRIM) 300 MG tablet Take 300 mg by mouth daily.    . Ascorbic Acid (VITAMIN C) 100 MG tablet Take 100 mg by mouth daily.    . fenofibrate micronized (LOFIBRA) 134 MG capsule Take 1 capsule by mouth once daily  with a meal 90 capsule 3  . Icosapent Ethyl (VASCEPA) 1 g CAPS Take 2 capsules (2 g total) by mouth 2 (two) times daily. 360 capsule 3  . Iron TABS Take 1 tablet by mouth daily.    Marland Kitchen losartan (COZAAR) 100 MG tablet Take 100 mg by mouth daily.    . metFORMIN (GLUCOPHAGE) 500 MG tablet Take 1,000 mg by mouth 2 (two) times daily with a meal.    . potassium citrate (UROCIT-K) 10 MEQ (1080 MG) SR tablet Take 10 mEq by mouth 2 (two) times daily.    . sitaGLIPtin (JANUVIA) 100 MG tablet Take 100 mg by mouth daily.    Marland Kitchen aspirin EC 81 MG tablet Take 81 mg by mouth daily. (Patient not taking: Reported on 12/02/2019)     No current facility-administered medications on file prior to visit.    LABS/IMAGING: No results found for this or any previous visit (from the past 48 hour(s)). No results found.  LIPID PANEL:    Component Value Date/Time   CHOL 177 11/30/2019 0819   TRIG 998 (HH) 11/30/2019 0819   HDL 22 (L) 11/30/2019 0819   CHOLHDL 8.0 (H) 11/30/2019 0819   LDLCALC Comment (A) 11/30/2019 0819   LDLDIRECT 38 11/30/2019 0819    WEIGHTS: Wt Readings from Last 3 Encounters:  12/02/19 (!) 302 lb 6.4 oz (137.2 kg)  03/25/19 (!) 312 lb 12.8 oz (141.9 kg)  01/01/19 (!) 315 lb 12.8 oz (143.2 kg)    VITALS: BP 120/64   Pulse 83   Ht 6\' 2"  (1.88 m)   Wt (!) 302 lb 6.4 oz (137.2 kg)   SpO2 98%   BMI 38.83 kg/m   EXAM: Deferred  EKG: Deferred  ASSESSMENT: 1. Primary hypertriglyceridemia 2. Elevated CAC score of 100 3. Morbid obesity 4. Type 2 diabetes   PLAN: 1.   Jesse Hall had a recent increase in triglycerides from the 500s up to 998, but not any notable dietary changes.  He may be less active but has lost 7 pounds.  I will pass on his name for possible inclusion in the follow-up study for the apoC3 inhibitor trial. We discussed the very low likelihood of the rare disorder b-sitosterolemia - this would require send-out testing and is not commonly performed. Since he had some  improvement in his numbers with therapy, I think this is not likely the diagnosis.   Follow-up with repeat lipids in 6 months.  Jesse Rosenthal, MD, [facility], FACP  [place]  [facility]  Medical Director of the Advanced Lipid Disorders &  Cardiovascular Risk Reduction Clinic Diplomate of the American Board of Clinical Lipidology Attending Cardiologist  Direct Dial: [phone]  Fax: [phone]  Website:  [email or web address] Stephani Janak 12/02/2019, 9:14 AM

## 2019-12-09 ENCOUNTER — Telehealth: Payer: Self-pay | Admitting: Internal Medicine

## 2019-12-09 NOTE — Telephone Encounter (Signed)
Follow UP:    Pt said Dr [facility] nurse was supposed to be checking on some type of tet for him. He wanted to know if she found out anything about this test,

## 2019-12-09 NOTE — Telephone Encounter (Signed)
Patient called and made aware there are no general screening labs thru [facility] or [facility] for the condition beta sitosterolemia that Dr. Rennis Golden mentioned at last office visit. Advised patient that I can check with our genetics doctor to see if this is something she can screen for. Message has been sent to Dr. Jomarie Longs

## 2020-01-22 ENCOUNTER — Telehealth: Payer: Self-pay | Admitting: Hematology and Oncology

## 2020-01-22 NOTE — Telephone Encounter (Signed)
Received a new hem referral from Dr. Link Snuffer for hemochromatosis. [name] has been cld and scheduled to see Dr. Pamelia Hoit on 9/28 at 8:15am. Pt aware to arrive 30 minutes early.

## 2020-01-26 ENCOUNTER — Encounter: Payer: Self-pay | Admitting: Neurology

## 2020-01-27 ENCOUNTER — Encounter: Payer: Self-pay | Admitting: Neurology

## 2020-01-27 ENCOUNTER — Ambulatory Visit: Payer: BC Managed Care – PPO | Admitting: Neurology

## 2020-01-27 ENCOUNTER — Other Ambulatory Visit: Payer: Self-pay

## 2020-01-27 VITALS — BP 132/75 | HR 83 | Ht 74.0 in | Wt 300.0 lb

## 2020-01-27 DIAGNOSIS — Z9989 Dependence on other enabling machines and devices: Secondary | ICD-10-CM | POA: Diagnosis not present

## 2020-01-27 DIAGNOSIS — G4733 Obstructive sleep apnea (adult) (pediatric): Secondary | ICD-10-CM | POA: Diagnosis not present

## 2020-01-27 DIAGNOSIS — G4719 Other hypersomnia: Secondary | ICD-10-CM

## 2020-01-27 DIAGNOSIS — E669 Obesity, unspecified: Secondary | ICD-10-CM | POA: Diagnosis not present

## 2020-01-27 NOTE — Patient Instructions (Signed)
  Based on your symptoms and your exam I believe you are still at risk for obstructive sleep apnea and would benefit from reevaluation as it has been many years and you need new supplies and an updated machine. Therefore, I think we should proceed with a sleep study to determine how severe your sleep apnea is.  As discussed, we will proceed with a home sleep test for reevaluation purposes.   Please remember, the risks and ramifications of moderate to severe obstructive sleep apnea or OSA are: Cardiovascular disease, including congestive heart failure, stroke, difficult to control hypertension, arrhythmias, and even type 2 diabetes has been linked to untreated OSA. Sleep apnea causes disruption of sleep and sleep deprivation in most cases, which, in turn, can cause recurrent headaches, problems with memory, mood, concentration, focus, and vigilance. Most people with untreated sleep apnea report excessive daytime sleepiness, which can affect their ability to drive. Please do not drive if you feel sleepy.   I plan to see you back after your sleep study to go over the test results and where to go from there. We will call you after your sleep study to advise about the results (most likely, you will hear from [facility], my nurse) and to set up an appointment at the time, as necessary.    Our sleep lab administrative assistant will call you to schedule your home sleep test equipment pickup. If you don't hear back from her by about 2 weeks from now, please feel free to call her at [phone]. You can leave a message with your phone number and concerns, if you get the voicemail box. She will call back as soon as possible.

## 2020-01-27 NOTE — Progress Notes (Signed)
Subjective:    Patient ID: Jesse Hall is a 59 y.o. male.  HPI     Jesse Foley, MD, PhD [facility] [address] [address] [place], [place] [address]  Dear Dr. Link Snuffer,   I saw your patient, Jesse Hall, upon your kind request, in my sleep clinic today for Initial consultation of his sleep disorder, in particular, evaluation of his prior diagnosis of obstructive sleep apnea.  The patient is unaccompanied today.  As you know, Jesse Hall is a 59 year old right-handed gentleman with an underlying medical history of hyperlipidemia, gout, diabetes, back pain, obesity, who was diagnosed with obstructive sleep apnea several years ago.  Prior sleep study results are not available for my review today.  He has a CPAP machine which is set at 12 cm, his machine is about 58 years old. I was able to review CPAP compliance data from his CPAP machine which he used until August 20.  His average usage was 6 hours and 51 minutes, average AHI 1/h, leak on the higher side with a 95th percentile at 17.2 L/min, pressure of 12 cm with EPR of 2.  Since August he started using another machine which is an AutoPap which he got from a friend.  Settings are not known and compliance data not available.  He reports that he felt better after starting the AutoPap machine. His Epworth sleepiness score is 12 out of 24, fatigue severity score is 54 out of 63.  He is a non-smoker and does not drink alcohol and does not drink caffeine daily, typically 1 glass of tea about twice a week.  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  Has to go back to the office more now.  His nephew and nephew's wife live with him.  He is single, no children, no pets in the house.  His original sleep apnea diagnosis was about 18 years ago and his current machine is his second machine.  When he started using his friend's machine he felt a noticeable improvement in his daytime  energy.  He uses a nasal mask and his DME company is [employer].  He denies any night to night nocturia or recurrent morning headaches.  He suspects that he has a family history of sleep apnea but no one has been officially tested.  His Past Medical History Is Significant For: Past Medical History:  Diagnosis Date  . Diabetes mellitus without complication (HCC)   . High cholesterol   . Hypertension   . Kidney failure   . Kidney stones     His Past Surgical History Is Significant For: Past Surgical History:  Procedure Laterality Date  . kidney stone removal      His Family History Is Significant For: Family History  Problem Relation Age of Onset  . CAD Mother     His Social History Is Significant For: Social History   Socioeconomic History  . Marital status: Single    Spouse name: Not on file  . Number of children: Not on file  . Years of education: Not on file  . Highest education level: Not on file  Occupational History  . Not on file  Tobacco Use  . Smoking status: Never Smoker  . Smokeless tobacco: Never Used  Substance and Sexual Activity  . Alcohol use: No  . Drug use: Not on file  . Sexual activity: Not on file  Other Topics Concern  .  Not on file  Social History Narrative  . Not on file   Social Determinants of Health   Financial Resource Strain:   . Difficulty of Paying Living Expenses: Not on file  Food Insecurity:   . Worried About Programme researcher, broadcasting/film/video in the Last Year: Not on file  . Ran Out of Food in the Last Year: Not on file  Transportation Needs:   . Lack of Transportation (Medical): Not on file  . Lack of Transportation (Non-Medical): Not on file  Physical Activity:   . Days of Exercise per Week: Not on file  . Minutes of Exercise per Session: Not on file  Stress:   . Feeling of Stress : Not on file  Social Connections:   . Frequency of Communication with Friends and Family: Not on file  . Frequency of Social Gatherings with Friends  and Family: Not on file  . Attends Religious Services: Not on file  . Active Member of Clubs or Organizations: Not on file  . Attends Banker Meetings: Not on file  . Marital Status: Not on file    His Allergies Are:  No Known Allergies:   His Current Medications Are:  Outpatient Encounter Medications as of 01/27/2020  Medication Sig  . allopurinol (ZYLOPRIM) 300 MG tablet Take 300 mg by mouth daily.  Marland Kitchen b complex vitamins tablet Take 1 tablet by mouth daily.  . fenofibrate micronized (LOFIBRA) 134 MG capsule Take 1 capsule by mouth once daily with a meal  . Icosapent Ethyl (VASCEPA) 1 g CAPS Take 2 capsules (2 g total) by mouth 2 (two) times daily.  Marland Kitchen losartan (COZAAR) 100 MG tablet Take 100 mg by mouth daily.  . metFORMIN (GLUCOPHAGE) 500 MG tablet Take 1,000 mg by mouth 2 (two) times daily with a meal.  . potassium citrate (UROCIT-K) 10 MEQ (1080 MG) SR tablet Take 10 mEq by mouth 2 (two) times daily.  . sitaGLIPtin (JANUVIA) 100 MG tablet Take 100 mg by mouth daily.  Marland Kitchen VITAMIN D PO Take 1 tablet by mouth daily.  . [DISCONTINUED] Ascorbic Acid (VITAMIN C) 100 MG tablet Take 100 mg by mouth daily.  . [DISCONTINUED] aspirin EC 81 MG tablet Take 81 mg by mouth daily. (Patient not taking: Reported on 12/02/2019)  . [DISCONTINUED] Iron TABS Take 1 tablet by mouth daily.   No facility-administered encounter medications on file as of 01/27/2020.  :  Review of Systems:  Out of a complete 14 point review of systems, all are reviewed and negative with the exception of these symptoms as listed below: Review of Systems  Neurological:       Pt came in today as new patient with complaints of feeling fatigue. States that he had a SS 8 years ago and was set up with CPAP. Pt states that recently has felt very tired, difficulty with concentrating and falling asleep at odd times. DME [employer]. Epworth Sleepiness Scale 0= would never doze 1= slight chance of dozing 2= moderate chance  of dozing 3= high chance of dozing  Sitting and reading:2 Watching TV:2 Sitting inactive in a public place (ex. Theater or meeting):1 As a passenger in a car for an hour without a break:2 Lying down to rest in the afternoon:3 Sitting and talking to someone:0 Sitting quietly after lunch (no alcohol):2 In a car, while stopped in traffic:0 Total:12     Objective:  Neurological Exam  Physical Exam Physical Examination:   There were no vitals filed for this  visit.  General Examination: The patient is a very pleasant 59 y.o. male in no acute distress. He appears well-developed and well-nourished and well groomed.   HEENT: Normocephalic, atraumatic, pupils are equal, round and reactive to light.  Extraocular tracking is preserved, hearing grossly intact, speech is soft, he reports that he has trouble enunciating.  He has seen a surgeon in the past for trouble with his voice and excess tissue in his throat or neck.  Hearing is grossly intact, face is symmetric with mild decrease in facial animation.  No carotid bruits.  Airway examination reveals small mouth opening, small airway entry, Mallampati class III, redundant soft palate, uvula and tonsils not fully visualized.  Neck circumference of 19-1/2 inches.  Tongue protrudes centrally in palate elevates symmetrically.   Chest: Clear to auscultation without wheezing, rhonchi or crackles noted.  Heart: S1+S2+0, regular and normal without murmurs, rubs or gallops noted.   Abdomen: Soft, non-tender and non-distended with normal bowel sounds appreciated on auscultation.  Extremities: There is no pitting edema in the distal lower extremities bilaterally. Pedal pulses are intact.  Skin: Warm and dry without trophic changes noted.   Musculoskeletal: exam reveals no obvious joint deformities, tenderness or joint swelling or erythema.   Neurologically:  Mental status: The patient is awake, alert and oriented in all 4 spheres. His immediate and  remote memory, attention, language skills and fund of knowledge are appropriate. There is no evidence of aphasia, agnosia, apraxia or anomia. Speech is clear with normal prosody and enunciation. Thought process is linear. Mood is normal and affect is blunted.  Cranial nerves II - XII are as described above under HEENT exam. In addition: shoulder shrug is normal with equal shoulder height noted. Motor exam: Normal bulk, strength and tone is noted. There is no drift, tremor or rebound. Fine motor skills and coordination: Grossly intact.  Cerebellar testing: No dysmetria or intention tremor.  Sensory exam: intact to light touch in the upper and lower extremities.  Gait, station and balance: He stands without difficulty, posture is age-appropriate.  He walks without difficulty.   Assessment and Plan:  In summary, Jesse Hall is a very pleasant 59 y.o.-year old male with an underlying medical history of hyperlipidemia, gout, diabetes, back pain, obesity, who presents for evaluation of his obstructive sleep apnea.  He was originally diagnosed some 18 years ago.  He had 2 sleep studies, prior results are not available for my review, but he recalls that he was told he had severe sleep apnea.  He is on his second CPAP machine but recently started using a friend's AutoPap machine.  His CPAP of 12 cm resulted in an average AHI of 1/h which is very good.  He uses a nasal mask.  He has been fully compliant with his CPAP from what I can see on his download.  He says that he cannot sleep well without his CPAP.  He does qualify for new equipment.  Since he has not had a sleep study in years, we will proceed with reevaluation with a home sleep test.  He is agreeable.  We will call him to schedule his home sleep test soon.  He is encouraged to continue treatment in the interim.  He is also working on weight loss.  He has lost some weight since last year.  I plan to see him back after testing.  I will prescribe new equipment  based on his home sleep test results.  I answered all his questions today and  he was in agreement.  Thank you very much for allowing me to participate in the care of this nice patient. If I can be of any further assistance to you please do not hesitate to call me at [phone].  Sincerely,   Jesse FoleySaima Kingston Shawgo, MD, PhD

## 2020-02-08 NOTE — Progress Notes (Signed)
[facility] CONSULT NOTE  Patient Care Team: Alysia Penna, MD as PCP - General (Internal Medicine)  CHIEF COMPLAINTS/PURPOSE OF CONSULTATION:  Newly diagnosed hemochromatosis  HISTORY OF PRESENTING ILLNESS:  Jesse Hall 59 y.o. male is here because of recent diagnosis of hemochromatosis. He is referred by Dr. Link Snuffer. Labs on 01/09/20 showed one copy of the H63D mutation, Hg 16.4, HCT 47.5, platelets 168, iron saturation 29%, ferritin 549. He presents to the clinic today for initial evaluation.  He has had multiple chronic medical issues including type 2 diabetes as well as hyperlipidemia primarily hypertriglyceridemia.  He has obstructive sleep apnea and has been on CPAP for the past 18 years.  He tells me that over the past week he has gotten short of breath to minimal exertion and some chest tightness which she initially attributed to feeling gaseous in the stomach.  He is also reporting right leg pain especially behind the calf.  He does see cardiology and his primary care for his chronic medical issues. Patient does not have any family history of hemochromatosis.  At least nobody has been tested for that yet.  He has been taking oral iron therapy intermittently over the past several years especially when he feels more short of breath.  He also takes vitamin C along with that.  I reviewed his records extensively and collaborated the history with the patient.  MEDICAL HISTORY:  Past Medical History:  Diagnosis Date  . Diabetes mellitus without complication (HCC)   . High cholesterol   . Hypertension   . Kidney failure   . Kidney stones     SURGICAL HISTORY: Past Surgical History:  Procedure Laterality Date  . kidney stone removal      SOCIAL HISTORY: Social History   Socioeconomic History  . Marital status: Single    Spouse name: Not on file  . Number of children: Not on file  . Years of education: Not on file  . Highest education level: Not on file    Occupational History  . Not on file  Tobacco Use  . Smoking status: Never Smoker  . Smokeless tobacco: Never Used  Substance and Sexual Activity  . Alcohol use: No  . Drug use: Not on file  . Sexual activity: Not on file  Other Topics Concern  . Not on file  Social History Narrative  . Not on file   Social Determinants of Health   Financial Resource Strain:   . Difficulty of Paying Living Expenses: Not on file  Food Insecurity:   . Worried About Programme researcher, broadcasting/film/video in the Last Year: Not on file  . Ran Out of Food in the Last Year: Not on file  Transportation Needs:   . Lack of Transportation (Medical): Not on file  . Lack of Transportation (Non-Medical): Not on file  Physical Activity:   . Days of Exercise per Week: Not on file  . Minutes of Exercise per Session: Not on file  Stress:   . Feeling of Stress : Not on file  Social Connections:   . Frequency of Communication with Friends and Family: Not on file  . Frequency of Social Gatherings with Friends and Family: Not on file  . Attends Religious Services: Not on file  . Active Member of Clubs or Organizations: Not on file  . Attends Banker Meetings: Not on file  . Marital Status: Not on file  Intimate Partner Violence:   . Fear of Current or Ex-Partner: Not on  file  . Emotionally Abused: Not on file  . Physically Abused: Not on file  . Sexually Abused: Not on file    FAMILY HISTORY: Family History  Problem Relation Age of Onset  . CAD Mother     ALLERGIES:  has No Known Allergies.  MEDICATIONS:  Current Outpatient Medications  Medication Sig Dispense Refill  . allopurinol (ZYLOPRIM) 300 MG tablet Take 300 mg by mouth daily.    Marland Kitchen b complex vitamins tablet Take 1 tablet by mouth daily.    . fenofibrate micronized (LOFIBRA) 134 MG capsule Take 1 capsule by mouth once daily with a meal 90 capsule 3  . Icosapent Ethyl (VASCEPA) 1 g CAPS Take 2 capsules (2 g total) by mouth 2 (two) times daily. 360  capsule 3  . losartan (COZAAR) 100 MG tablet Take 100 mg by mouth daily.    . metFORMIN (GLUCOPHAGE) 500 MG tablet Take 1,000 mg by mouth 2 (two) times daily with a meal.    . potassium citrate (UROCIT-K) 10 MEQ (1080 MG) SR tablet Take 10 mEq by mouth 2 (two) times daily.    . sitaGLIPtin (JANUVIA) 100 MG tablet Take 100 mg by mouth daily.    Marland Kitchen VITAMIN D PO Take 1 tablet by mouth daily.     No current facility-administered medications for this visit.    REVIEW OF SYSTEMS:   Mild epigastric chest discomfort and shortness of breath to minimal exertion and the right calf pain All other systems were reviewed with the patient and are negative.  PHYSICAL EXAMINATION: ECOG PERFORMANCE STATUS: 1 - Symptomatic but completely ambulatory  Vitals:   02/09/20 0816  BP: 112/78  Pulse: 95  Resp: 18  Temp: (!) 97.2 F (36.2 C)  SpO2: 96%   Filed Weights   02/09/20 0816  Weight: 288 lb 1.6 oz (130.7 kg)       LABORATORY DATA:  I have reviewed the data as listed Lab Results  Component Value Date   WBC 14.1 (H) 09/28/2014   HGB 17.6 (H) 09/28/2014   HCT 50.4 09/28/2014   MCV 84.1 09/28/2014   PLT 175 09/28/2014   Lab Results  Component Value Date   NA 135 09/28/2014   K 4.6 09/28/2014   CL 101 09/28/2014   CO2 20 (L) 09/28/2014    RADIOGRAPHIC STUDIES: I have personally reviewed the radiological reports and agreed with the findings in the report.  ASSESSMENT AND PLAN:  Hereditary hemochromatosis (HCC) Lab review: Ferritin 549, iron saturation 29%, hemochromatosis gene testing: Single H63D mutation identified  Counseling: I discussed with the patient the entire mechanism of hemochromatosis.  We discussed the clinical symptoms of hemochromatosis including elevation of liver function tests as well as skin hyperpigmentation, diabetes, arthralgias and sometimes EKG abnormalities. I believe his symptoms are not very severe because he is a heterozygous mutation.  Interestingly his  iron saturation was only 29%.  Usually inherited 3 hemochromatosis it runs about 70 or 80%.  Plan: Phlebotomy to remove iron at [facility] once every 2 months x 3 Goal: Ferritin level below 100  Shortness of breath to minimal exertion mild chest discomfort as well as right lower extremity discomfort: Will obtain ultrasound of the right lower extremity as well as a VQ scan of his chest.  CT PE protocol cannot be done because of his chronic kidney disease.  Return to clinic in 6 months with labs done ahead of time and then we can determine if he needs more or less of  phlebotomies at that time..    All questions were answered. The patient knows to call the clinic with any problems, questions or concerns.   Sabas Sous, MD, MPH 02/09/2020    I, Molly Dorshimer, am acting as scribe for Serena Croissant, MD.  I have reviewed the above documentation for accuracy and completeness, and I agree with the above.

## 2020-02-09 ENCOUNTER — Other Ambulatory Visit: Payer: Self-pay | Admitting: *Deleted

## 2020-02-09 ENCOUNTER — Inpatient Hospital Stay: Payer: BC Managed Care – PPO | Attending: Hematology and Oncology | Admitting: Hematology and Oncology

## 2020-02-09 ENCOUNTER — Other Ambulatory Visit: Payer: Self-pay

## 2020-02-09 DIAGNOSIS — Z7901 Long term (current) use of anticoagulants: Secondary | ICD-10-CM | POA: Insufficient documentation

## 2020-02-09 DIAGNOSIS — R0789 Other chest pain: Secondary | ICD-10-CM

## 2020-02-09 DIAGNOSIS — R000 Tachycardia, unspecified: Secondary | ICD-10-CM | POA: Diagnosis not present

## 2020-02-09 DIAGNOSIS — R0602 Shortness of breath: Secondary | ICD-10-CM

## 2020-02-09 DIAGNOSIS — Z7984 Long term (current) use of oral hypoglycemic drugs: Secondary | ICD-10-CM | POA: Diagnosis not present

## 2020-02-09 DIAGNOSIS — E119 Type 2 diabetes mellitus without complications: Secondary | ICD-10-CM | POA: Insufficient documentation

## 2020-02-09 DIAGNOSIS — Z79899 Other long term (current) drug therapy: Secondary | ICD-10-CM | POA: Diagnosis not present

## 2020-02-09 NOTE — Assessment & Plan Note (Signed)
Lab review: Ferritin 549, iron saturation 29%, hemochromatosis gene testing: Single H63D mutation identified  Counseling: I discussed with the patient the entire mechanism of hemochromatosis.  We discussed the clinical symptoms of hemochromatosis including elevation of liver function tests as well as skin hyperpigmentation, diabetes, arthralgias and sometimes EKG abnormalities. The severity of the symptoms depending on whether the patient is homozygous or heterozygous.  Plan: Phlebotomy to remove iron once a month for 3 months Goal: Ferritin level below 100  After the initial 3 months, we will decide how often he needs to have phlebotomy.

## 2020-02-10 ENCOUNTER — Inpatient Hospital Stay: Payer: BC Managed Care – PPO

## 2020-02-10 ENCOUNTER — Ambulatory Visit (HOSPITAL_COMMUNITY)
Admission: RE | Admit: 2020-02-10 | Discharge: 2020-02-10 | Disposition: A | Discharge status: Home / Self Care | Payer: BC Managed Care – PPO | Source: Ambulatory Visit | Attending: Hematology and Oncology | Admitting: Hematology and Oncology

## 2020-02-10 ENCOUNTER — Inpatient Hospital Stay (HOSPITAL_BASED_OUTPATIENT_CLINIC_OR_DEPARTMENT_OTHER): Payer: BC Managed Care – PPO | Admitting: Hematology and Oncology

## 2020-02-10 VITALS — BP 125/78 | HR 95 | Temp 98.1°F | Resp 18 | Ht 74.0 in | Wt 287.3 lb

## 2020-02-10 DIAGNOSIS — I82411 Acute embolism and thrombosis of right femoral vein: Secondary | ICD-10-CM

## 2020-02-10 DIAGNOSIS — I824Z1 Acute embolism and thrombosis of unspecified deep veins of right distal lower extremity: Secondary | ICD-10-CM | POA: Diagnosis not present

## 2020-02-10 DIAGNOSIS — R0789 Other chest pain: Secondary | ICD-10-CM

## 2020-02-10 LAB — CBC WITH DIFFERENTIAL (CANCER CENTER ONLY)
Abs Immature Granulocytes: 0.07 10*3/uL (ref 0.00–0.07)
Basophils Absolute: 0.1 10*3/uL (ref 0.0–0.1)
Basophils Relative: 1 %
Eosinophils Absolute: 0.5 10*3/uL (ref 0.0–0.5)
Eosinophils Relative: 5 %
HCT: 42.1 % (ref 39.0–52.0)
Hemoglobin: 14.4 g/dL (ref 13.0–17.0)
Immature Granulocytes: 1 %
Lymphocytes Relative: 16 %
Lymphs Abs: 1.8 10*3/uL (ref 0.7–4.0)
MCH: 28.6 pg (ref 26.0–34.0)
MCHC: 34.2 g/dL (ref 30.0–36.0)
MCV: 83.7 fL (ref 80.0–100.0)
Monocytes Absolute: 0.7 10*3/uL (ref 0.1–1.0)
Monocytes Relative: 6 %
Neutro Abs: 8.5 10*3/uL — ABNORMAL HIGH (ref 1.7–7.7)
Neutrophils Relative %: 71 %
Platelet Count: 214 10*3/uL (ref 150–400)
RBC: 5.03 MIL/uL (ref 4.22–5.81)
RDW: 13.2 % (ref 11.5–15.5)
WBC Count: 11.8 10*3/uL — ABNORMAL HIGH (ref 4.0–10.5)
nRBC: 0 % (ref 0.0–0.2)

## 2020-02-10 LAB — IRON AND TIBC
Iron: 36 ug/dL — ABNORMAL LOW (ref 42–163)
Saturation Ratios: 12 % — ABNORMAL LOW (ref 20–55)
TIBC: 293 ug/dL (ref 202–409)
UIBC: 256 ug/dL (ref 117–376)

## 2020-02-10 LAB — CMP (CANCER CENTER ONLY)
ALT: 33 U/L (ref 0–44)
AST: 28 U/L (ref 15–41)
Albumin: 4 g/dL (ref 3.5–5.0)
Alkaline Phosphatase: 44 U/L (ref 38–126)
Anion gap: 8 (ref 5–15)
BUN: 28 mg/dL — ABNORMAL HIGH (ref 6–20)
CO2: 25 mmol/L (ref 22–32)
Calcium: 9.5 mg/dL (ref 8.9–10.3)
Chloride: 106 mmol/L (ref 98–111)
Creatinine: 2.3 mg/dL — ABNORMAL HIGH (ref 0.61–1.24)
GFR, Est AFR Am: 35 mL/min — ABNORMAL LOW (ref >60)
GFR, Estimated: 30 mL/min — ABNORMAL LOW (ref >60)
Glucose, Bld: 155 mg/dL — ABNORMAL HIGH (ref 70–99)
Potassium: 4.4 mmol/L (ref 3.5–5.1)
Sodium: 139 mmol/L (ref 135–145)
Total Bilirubin: 1.1 mg/dL (ref 0.3–1.2)
Total Protein: 7.6 g/dL (ref 6.5–8.1)

## 2020-02-10 LAB — FERRITIN: Ferritin: 965 ng/mL — ABNORMAL HIGH (ref 24–336)

## 2020-02-10 MED ORDER — RIVAROXABAN (XARELTO) VTE STARTER PACK (15 & 20 MG): ORAL_TABLET | ORAL | 0 refills | Status: DC

## 2020-02-10 NOTE — Assessment & Plan Note (Addendum)
Patient was complaining of right leg pain and ultrasound of the leg revealed extensive DVT involving the femoral-popliteal gastrocnemius and soleus veins  Plan: 1.  Start Xarelto 2. obtain lupus anticoagulant and factor V Leiden studies to determine the duration of anticoagulation. 3.  VQ scan has been scheduled for next week.  The patient feels worsening symptoms of chest pain or severe shortness of breath then he will need to come to the emergency room. Return to clinic in 1 month for follow-up.

## 2020-02-10 NOTE — Progress Notes (Signed)
Patient Care Team: Alysia Penna, MD as PCP - General (Internal Medicine)  DIAGNOSIS:  Encounter Diagnoses  Name Primary?  . Acute deep vein thrombosis (DVT) of femoral vein of right lower extremity (HCC) Yes  . Lower leg DVT (deep venous thromboembolism), acute, left (HCC)      CHIEF COMPLIANT: Ultrasound revealing right leg DVT  INTERVAL HISTORY: Jesse Hall is a 59 year old with hemochromatosis diagnosis who had complained of pain in discomfort in the right leg and we obtained margin and ultrasound of the leg at this morning which revealed extensive DVT involving the femoral popliteal and gastrin images and soleus veins.  We immediately saw him back in our office to discuss anticoagulation.  He tells me that yesterday he had episode of tachycardia and shortness of breath which resolved and he is breathing better and at his baseline.   ALLERGIES:  has No Known Allergies.  MEDICATIONS:  Current Outpatient Medications  Medication Sig Dispense Refill  . allopurinol (ZYLOPRIM) 300 MG tablet Take 300 mg by mouth daily.    Marland Kitchen b complex vitamins tablet Take 1 tablet by mouth daily.    . fenofibrate micronized (LOFIBRA) 134 MG capsule Take 1 capsule by mouth once daily with a meal 90 capsule 3  . Icosapent Ethyl (VASCEPA) 1 g CAPS Take 2 capsules (2 g total) by mouth 2 (two) times daily. 360 capsule 3  . losartan (COZAAR) 100 MG tablet Take 100 mg by mouth daily.    . metFORMIN (GLUCOPHAGE) 500 MG tablet Take 1,000 mg by mouth 2 (two) times daily with a meal.    . potassium citrate (UROCIT-K) 10 MEQ (1080 MG) SR tablet Take 10 mEq by mouth 2 (two) times daily.    Marland Kitchen RIVAROXABAN (XARELTO) VTE STARTER PACK (15 & 20 MG TABLETS) Follow package directions: Take one 15mg  tablet by mouth twice a day. On day 22, switch to one 20mg  tablet once a day. Take with food. 51 each 0  . sitaGLIPtin (JANUVIA) 100 MG tablet Take 100 mg by mouth daily.    VITAMIN D PO Take 1 tablet by mouth daily.      No current facility-administered medications for this visit.    PHYSICAL EXAMINATION: ECOG PERFORMANCE STATUS: 1 - Symptomatic but completely ambulatory  Vitals:   02/10/20 1104  BP: 125/78  Pulse: 95  Resp: 18  Temp: 98.1 F (36.7 C)  SpO2: 95%   Filed Weights   02/10/20 1104  Weight: 287 lb 4.8 oz (130.3 kg)      LABORATORY DATA:  I have reviewed the data as listed CMP Latest Ref Rng & Units 02/10/2020 09/28/2014  Glucose 70 - 99 mg/dL 02/12/2020) 09/30/2014)  BUN 6 - 20 mg/dL 101(B) 510(C)  Creatinine 0.61 - 1.24 mg/dL 58(N) 27(P)  Sodium 135 - 145 mmol/L 139 135  Potassium 3.5 - 5.1 mmol/L 4.4 4.6  Chloride 98 - 111 mmol/L 106 101  CO2 22 - 32 mmol/L 25 20(L)  Calcium 8.9 - 10.3 mg/dL 9.5 9.2  Total Protein 6.5 - 8.1 g/dL 7.6 7.4  Total Bilirubin 0.3 - 1.2 mg/dL 1.1 8.24(M)  Alkaline Phos 38 - 126 U/L 44 48  AST 15 - 41 U/L 28 34  ALT 0 - 44 U/L 33 48    Lab Results  Component Value Date   WBC 11.8 (H) 02/10/2020   HGB 14.4 02/10/2020   HCT 42.1 02/10/2020   MCV 83.7 02/10/2020   PLT 214 02/10/2020   NEUTROABS 8.5 (H) 02/10/2020  ASSESSMENT & PLAN:  Lower leg DVT (deep venous thromboembolism), acute, left (HCC)  Patient was complaining of left leg pain and ultrasound of the leg revealed extensive DVT involving the femoral-popliteal gastrocnemius and soleus veins  Plan: 1.  Start Xarelto 2. obtain lupus anticoagulant and factor V Leiden studies to determine the duration of anticoagulation. 3.  VQ scan has been scheduled for next week.  The patient feels worsening symptoms of chest pain or severe shortness of breath then he will need to come to the emergency room. Return to clinic in 1 month for follow-up.  Hemochromatosis: Patient will get phlebotomy every 2 months at [facility].   Orders Placed This Encounter  Procedures  . Lupus anticoagulant panel    Standing Status:   Future    Number of Occurrences:   1    Standing Expiration Date:   02/09/2021   . Factor 5 leiden    Standing Status:   Future    Number of Occurrences:   1    Standing Expiration Date:   02/09/2021  . Cardiolipin antibodies, IgG, IgM, IgA*    Standing Status:   Future    Number of Occurrences:   1    Standing Expiration Date:   02/09/2021  . Beta-2-glycoprotein i abs, IgG/M/A    Standing Status:   Future    Number of Occurrences:   1    Standing Expiration Date:   02/09/2021   The patient has a good understanding of the overall plan. he agrees with it. he will call with any problems that may develop before the next visit here. Total time spent: 30 mins including face to face time and time spent for planning, charting and co-ordination of care   Tamsen Meek, MD 02/10/20

## 2020-02-10 NOTE — Progress Notes (Signed)
Lower extremity venous RT study completed.  Spoke with [name]. Preliminary results relayed to Pamelia Hoit, MD.   See CV Proc for preliminary results report.   Jean Rosenthal, RDMS

## 2020-02-12 LAB — LUPUS ANTICOAGULANT PANEL
DRVVT: 48.9 s — ABNORMAL HIGH (ref 0.0–47.0)
PTT Lupus Anticoagulant: 41.2 s (ref 0.0–51.9)

## 2020-02-12 LAB — DRVVT CONFIRM: dRVVT Confirm: 1.3 ratio — ABNORMAL HIGH (ref 0.8–1.2)

## 2020-02-12 LAB — BETA-2-GLYCOPROTEIN I ABS, IGG/M/A
Beta-2 Glyco I IgG: <9 GPI IgG units (ref 0–20)
Beta-2-Glycoprotein I IgA: <9 GPI IgA units (ref 0–25)
Beta-2-Glycoprotein I IgM: <9 GPI IgM units (ref 0–32)

## 2020-02-12 LAB — DRVVT MIX: dRVVT Mix: 41.3 s — ABNORMAL HIGH (ref 0.0–40.4)

## 2020-02-13 LAB — CARDIOLIPIN ANTIBODIES, IGG, IGM, IGA
Anticardiolipin IgA: <9 APL U/mL (ref 0–11)
Anticardiolipin IgG: <9 GPL U/mL (ref 0–14)
Anticardiolipin IgM: 15 MPL U/mL — ABNORMAL HIGH (ref 0–12)

## 2020-02-15 LAB — FACTOR 5 LEIDEN

## 2020-02-16 ENCOUNTER — Inpatient Hospital Stay: Payer: BC Managed Care – PPO | Attending: Hematology and Oncology | Admitting: Hematology and Oncology

## 2020-02-16 ENCOUNTER — Ambulatory Visit (HOSPITAL_COMMUNITY)
Admission: RE | Admit: 2020-02-16 | Discharge: 2020-02-16 | Disposition: A | Discharge status: Home / Self Care | Payer: BC Managed Care – PPO | Source: Ambulatory Visit | Attending: Hematology and Oncology | Admitting: Hematology and Oncology

## 2020-02-16 ENCOUNTER — Other Ambulatory Visit: Payer: Self-pay

## 2020-02-16 DIAGNOSIS — I82409 Acute embolism and thrombosis of unspecified deep veins of unspecified lower extremity: Secondary | ICD-10-CM | POA: Diagnosis not present

## 2020-02-16 DIAGNOSIS — Z7984 Long term (current) use of oral hypoglycemic drugs: Secondary | ICD-10-CM | POA: Insufficient documentation

## 2020-02-16 DIAGNOSIS — R0789 Other chest pain: Secondary | ICD-10-CM | POA: Diagnosis not present

## 2020-02-16 DIAGNOSIS — I2699 Other pulmonary embolism without acute cor pulmonale: Secondary | ICD-10-CM | POA: Diagnosis not present

## 2020-02-16 DIAGNOSIS — Z79899 Other long term (current) drug therapy: Secondary | ICD-10-CM | POA: Insufficient documentation

## 2020-02-16 DIAGNOSIS — Z7901 Long term (current) use of anticoagulants: Secondary | ICD-10-CM | POA: Insufficient documentation

## 2020-02-16 DIAGNOSIS — I824Z1 Acute embolism and thrombosis of unspecified deep veins of right distal lower extremity: Secondary | ICD-10-CM

## 2020-02-16 MED ORDER — TECHNETIUM TO 99M ALBUMIN AGGREGATED
4.2000 | Freq: Once | INTRAVENOUS | Status: AC | PRN
  Administered 2020-02-16: 4.2 via INTRAVENOUS

## 2020-02-16 NOTE — Assessment & Plan Note (Signed)
VQ scan 02/16/2020: Acute multifocal right lung pulmonary embolism with suspicion of right upper lobe right middle lobe and right lower lobe pulmonary artery occlusion Current treatment: Xarelto 

## 2020-02-16 NOTE — Assessment & Plan Note (Signed)
Patient was complaining of left leg pain and ultrasound of the leg revealed extensive DVT involving the femoral-popliteal gastrocnemius and soleus veins  Lupus anticoagulant test: Positive We will need to repeat this in 3 months to confirm the presence of antiphospholipid antibody syndrome. If it is confirmed then he will remain on blood thinners for life.  Hemochromatosis: Gets phlebotomy every 2 months at [facility]. Return to clinic in 3 months with labs done ahead of time and follow-up.

## 2020-02-16 NOTE — Progress Notes (Signed)
Patient Care Team: Alysia Penna, MD as PCP - General (Internal Medicine)  DIAGNOSIS:  Encounter Diagnoses  Name Primary?  . Acute pulmonary embolism without acute cor pulmonale, unspecified pulmonary embolism type (HCC)   . Lower leg DVT (deep venous thromboembolism), acute, right (HCC)   . Hereditary hemochromatosis (HCC) Yes    CHIEF COMPLIANT: Follow-up after today's VQ scan  INTERVAL HISTORY: Jesse Hall is a 59 year old gentleman with above-mentioned history of left leg DVT and today he had a VQ scan which showed right lung pulmonary embolism.  He could not undergo a CT scan because of renal dysfunction.  He was started on Xarelto on 02/10/2020. He tells me that his symptoms are slightly better in terms of his leg pain.  His breathing is also mildly improved.   ALLERGIES:  has No Known Allergies.  MEDICATIONS:  Current Outpatient Medications  Medication Sig Dispense Refill  . allopurinol (ZYLOPRIM) 300 MG tablet Take 300 mg by mouth daily.    Marland Kitchen b complex vitamins tablet Take 1 tablet by mouth daily.    . fenofibrate micronized (LOFIBRA) 134 MG capsule Take 1 capsule by mouth once daily with a meal 90 capsule 3  . Icosapent Ethyl (VASCEPA) 1 g CAPS Take 2 capsules (2 g total) by mouth 2 (two) times daily. 360 capsule 3  . losartan (COZAAR) 100 MG tablet Take 100 mg by mouth daily.    . metFORMIN (GLUCOPHAGE) 500 MG tablet Take 1,000 mg by mouth 2 (two) times daily with a meal.    . potassium citrate (UROCIT-K) 10 MEQ (1080 MG) SR tablet Take 10 mEq by mouth 2 (two) times daily.    Marland Kitchen RIVAROXABAN (XARELTO) VTE STARTER PACK (15 & 20 MG TABLETS) Follow package directions: Take one 15mg  tablet by mouth twice a day. On day 22, switch to one 20mg  tablet once a day. Take with food. 51 each 0  . sitaGLIPtin (JANUVIA) 100 MG tablet Take 100 mg by mouth daily.    VITAMIN D PO Take 1 tablet by mouth daily.     No current facility-administered medications for this visit.     PHYSICAL EXAMINATION: ECOG PERFORMANCE STATUS: 1 - Symptomatic but completely ambulatory  Vitals:   02/16/20 1247  BP: 106/63  Pulse: 93  Temp: 97.7 F (36.5 C)  SpO2: 96%   There were no vitals filed for this visit.     LABORATORY DATA:  I have reviewed the data as listed CMP Latest Ref Rng & Units 02/10/2020 09/28/2014  Glucose 70 - 99 mg/dL 02/12/2020) 09/30/2014)  BUN 6 - 20 mg/dL 256(L) 893(T)  Creatinine 0.61 - 1.24 mg/dL 34(K) 87(G)  Sodium 135 - 145 mmol/L 139 135  Potassium 3.5 - 5.1 mmol/L 4.4 4.6  Chloride 98 - 111 mmol/L 106 101  CO2 22 - 32 mmol/L 25 20(L)  Calcium 8.9 - 10.3 mg/dL 9.5 9.2  Total Protein 6.5 - 8.1 g/dL 7.6 7.4  Total Bilirubin 0.3 - 1.2 mg/dL 1.1 8.11(X)  Alkaline Phos 38 - 126 U/L 44 48  AST 15 - 41 U/L 28 34  ALT 0 - 44 U/L 33 48    Lab Results  Component Value Date   WBC 11.8 (H) 02/10/2020   HGB 14.4 02/10/2020   HCT 42.1 02/10/2020   MCV 83.7 02/10/2020   PLT 214 02/10/2020   NEUTROABS 8.5 (H) 02/10/2020    ASSESSMENT & PLAN:  Acute pulmonary embolism (HCC) VQ scan 02/16/2020: Acute multifocal right lung pulmonary embolism with  suspicion of right upper lobe right middle lobe and right lower lobe pulmonary artery occlusion Current treatment: Xarelto  Lower leg DVT (deep venous thromboembolism), acute, right (HCC) Patient was complaining of left leg pain and ultrasound of the leg revealed extensive DVT involving the femoral-popliteal gastrocnemius and soleus veins  Lupus anticoagulant test: Positive We will need to repeat this in 3 months to confirm the presence of antiphospholipid antibody syndrome. If it is confirmed then he will remain on blood thinners for life.  Hemochromatosis: Gets phlebotomy every 2 months at [facility].  Return to clinic in 3 months with labs done ahead of time and virtual visit follow-up.    Orders Placed This Encounter  Procedures  . CBC with Differential (Cancer Center Only)    Standing Status:    Future    Standing Expiration Date:   02/15/2021  . Ferritin    Standing Status:   Future    Standing Expiration Date:   02/15/2021  . Iron and TIBC    Standing Status:   Future    Standing Expiration Date:   02/15/2021  . Lupus anticoagulant panel    Standing Status:   Future    Standing Expiration Date:   02/15/2021  . Cardiolipin antibodies, IgG, IgM, IgA*    Standing Status:   Future    Standing Expiration Date:   02/15/2021   The patient has a good understanding of the overall plan. he agrees with it. he will call with any problems that may develop before the next visit here. Total time spent: 30 mins including face to face time and time spent for planning, charting and co-ordination of care   Tamsen Meek, MD 02/16/20

## 2020-02-17 ENCOUNTER — Ambulatory Visit (INDEPENDENT_AMBULATORY_CARE_PROVIDER_SITE_OTHER): Payer: BC Managed Care – PPO | Admitting: Neurology

## 2020-02-17 DIAGNOSIS — G4719 Other hypersomnia: Secondary | ICD-10-CM

## 2020-02-17 DIAGNOSIS — G4733 Obstructive sleep apnea (adult) (pediatric): Secondary | ICD-10-CM | POA: Diagnosis not present

## 2020-02-17 DIAGNOSIS — E669 Obesity, unspecified: Secondary | ICD-10-CM

## 2020-02-18 NOTE — Progress Notes (Signed)
Patient referred by Dr. Link Snuffer for re-eval of his OSA; he has an older CPAP and a friend's autoPAP. Seen by me on 01/27/20, HST on 02/17/20.    Please call and notify the patient that the recent home sleep test confirmed obstructive sleep apnea in the moderate range. I recommend treatment with an autoPAP machine at home, through a DME company (of his choice, or as per insurance requirement). The DME representative will educate him on how to use the machine, how to put the mask on, etc. I have placed an order in the chart. Please send referral, talk to patient, send report to referring MD. We will need a FU in sleep clinic for 10 weeks post-PAP set up, please arrange that with me or one of our NPs. Thanks,   [name], MD, PhD [facility])

## 2020-02-18 NOTE — Procedures (Signed)
Sleep Study Report   Patient Information     First Name: Jesse Last Name: Hall Fildes: [id number]  Birth Date: 28-Feb-2061 Age: 59 Gender:  Referring Provider: Cletis Athens BMI: 38.5 (W=300 lb, H=6' 2'')  Neck Circ.:  19 '' Epworth:  12   Sleep Study Information    Study Date: 02/17/20 S/H/A Version: [id number] / 4.2.1023 / 57    History:    59 year old man with a history of hyperlipidemia, gout, diabetes, back pain, obesity, who was previously diagnosed with obstructive sleep apnea several years ago. He has an older CPAP machine which is set at 12 cm. He presents for re-evaluation.  Summary & Diagnosis:     OSA Recommendations:     This home sleep test demonstrates moderate obstructive sleep apnea with a total AHI of 23.2/hour and O2 nadir of 88%. Treatment with positive airway pressure is recommended and can be achieved with autoPAP titration/trial at home. A full night titration study can be considered for optimization of treatment settings, if needed down the road. Alternative treatment with an oral appliance can be considered with the help of a dentist. Weight loss should be encouraged and surgical options can be considered.  Please note, that untreated obstructive sleep apnea may carry additional perioperative morbidity. Patients with significant obstructive sleep apnea should receive perioperative PAP therapy and the surgeons and particularly the anesthesiologist should be informed of the diagnosis and the severity of the sleep disordered breathing. The patient should be cautioned not to drive, work at heights, or operate dangerous or heavy equipment when tired or sleepy. Review and reiteration of good sleep hygiene measures should be pursued with any patient. Other causes of the patient's symptoms, including circadian rhythm disturbances, an underlying mood disorder, medication effect and/or an underlying medical problem cannot be ruled out based on this test. Clinical correlation is  recommended. The patient and his referring provider will be notified of the test results. The patient will be seen in follow up in sleep clinic at [facility].  I certify that I have reviewed the raw data recording prior to the issuance of this report in accordance with the standards of the American Academy of Sleep Medicine (AASM).  [name], MD, PhD [facility]) Diplomat, ABPN (Neurology and Sleep)        Sleep Summary  Oxygen Saturation Statistics   Start Study Time: End Study Time: Total Recording Time:  11:13:27PM 6:10:57 AM 6 hrs,  Total Sleep Time % REM of Sleep Time:  4 hrs, 47 min 19.5    Mean: 94 Minimum: 88 Maximum: 98  Mean of Desaturations Nadirs (%):   91  Oxygen Desatur. %: 4-9 10-20 >20 Total  Events Number Total  63 100.0  0 0.0  0 0.0  63 100.0  Oxygen Saturation: <90 <=88 <85 <80 <70  Duration (minutes): Sleep % 1.8 0.5 0.6 0.2 0.0 0.0 0.0 0.0 0.0 0.0     Respiratory Indices      Total Events REM NREM All Night  pRDI: pAHI 3%: ODI 4%: pAHIc 3%: % CSR: pAHI 4%:  114  111  63  7 0.0 64 22.7 22.7 15.1 3.2 24.1 23.3 12.7 1.0 23.8 23.2 13.2 1.5 13.4       Pulse Rate Statistics during Sleep (BPM)      Mean: 74 Minimum: 51 Maximum: 92    Indices are calculated using technically valid sleep time of  4 hrs, 46 min. pRDI/pAHI are calculated using oxi desaturations ? 3%  pAHI=23.2                                                 Mild              Moderate                    Severe                                                 5              15                    30   Body Position Statistics  Position Supine Prone Right Left Non-Supine  Sleep (min) 239.0 0.0 37.5 11.0 48.5  Sleep % 83.1 0.0 13.0 3.8 16.9  pRDI 28.1 N/A 1.6 5.5 2.5  pAHI 3% 27.4 N/A 1.6 5.5 2.5  ODI 4% 15.6 N/A 1.6 0.0 1.3            Left   Right  Supine    Snoring  Statistics Snoring Level (dB) >40 >50 >60 >70 >80 >Threshold (45)  Sleep (min) 167.2 86.6 17.5 0.0 0.0 115.9  Sleep % 58.2 30.1 6.1 0.0 0.0 40.3    Mean: 46 dB Sleep Stages Chart                    Wake  Sleep      Wake  %  31.14    Sleep  68.86  %   Total:  100.00  %                                                          REM  Light  Deep      REM  19.48  %    Light  %  62.08    Deep  18.44  %   Total:  100.00  %                                 Sleep/Wake States  Sleep Stages  Sleep Latency (min):  REM Latency (min):  Number of Wakes:   19   67   15

## 2020-02-18 NOTE — Addendum Note (Signed)
Addended by: [name] on: 02/18/2020 07:00 PM   Modules accepted: Orders

## 2020-02-19 ENCOUNTER — Other Ambulatory Visit: Payer: Self-pay | Admitting: Internal Medicine

## 2020-02-19 ENCOUNTER — Other Ambulatory Visit: Payer: Self-pay | Admitting: *Deleted

## 2020-02-19 DIAGNOSIS — E781 Pure hyperglyceridemia: Secondary | ICD-10-CM

## 2020-02-23 ENCOUNTER — Telehealth: Payer: Self-pay

## 2020-02-23 NOTE — Telephone Encounter (Signed)
-----   Message from Jesse Foley, MD sent at 02/18/2020  7:00 PM EDT ----- Patient referred by Dr. Link Snuffer for re-eval of his OSA; he has an older CPAP and a friend's autoPAP. Seen by me on 01/27/20, HST on 02/17/20.    Please call and notify the patient that the recent home sleep test confirmed obstructive sleep apnea in the moderate range. I recommend treatment with an autoPAP machine at home, through a DME company (of his choice, or as per insurance requirement). The DME representative will educate him on how to use the machine, how to put the mask on, etc. I have placed an order in the chart. Please send referral, talk to patient, send report to referring MD. We will need a FU in sleep clinic for 10 weeks post-PAP set up, please arrange that with me or one of our NPs. Thanks,   Jesse Foley, MD, PhD [facility])

## 2020-02-23 NOTE — Telephone Encounter (Signed)
I called pt to discuss his sleep study results. He asked me to hold for 4 minutes. He came back to the phone and asked to call me back later.

## 2020-02-24 NOTE — Telephone Encounter (Signed)
I called pt. I advised pt that Dr. Frances Furbish reviewed their sleep study results and found that the recent sleep study show moderate osa. Dr. Frances Furbish recommends that pt start an autopap at home for treatment. I reviewed PAP compliance expectations with the pt. Pt is agreeable to starting an auto-PAP. I advised pt that an order will be sent to a DME, [facility], and [facility] will call the pt within about one week after they file with the pt's insurance. [facility] will show the pt how to use the machine, fit for masks, and troubleshoot the auto-PAP if needed. A follow up appt was made for insurance purposes with Dr. Frances Furbish on 05/19/2020 at 100pm . Pt verbalized understanding to arrive 15 minutes early and bring their auto-PAP. A letter with all of this information in it will be mailed to the pt as a reminder. I verified with the pt that the address we have on file is correct. Pt verbalized understanding of results. Pt had no questions at this time but was encouraged to call back if questions arise. I have sent the order to [facility] and have received confirmation that they have received the order.

## 2020-02-24 NOTE — Telephone Encounter (Signed)
Pt returned phone call.  

## 2020-03-09 ENCOUNTER — Ambulatory Visit: Payer: BC Managed Care – PPO | Attending: Family

## 2020-03-09 DIAGNOSIS — Z23 Encounter for immunization: Secondary | ICD-10-CM

## 2020-03-10 ENCOUNTER — Telehealth: Payer: Self-pay

## 2020-03-10 MED ORDER — RIVAROXABAN 20 MG PO TABS: 20.0000 mg | ORAL_TABLET | Freq: Every day | ORAL | 4 refills | Status: DC

## 2020-03-10 NOTE — Telephone Encounter (Signed)
Pt called asking about appts; these questions were answered. Pt also states he has completed xarelto starter pack and is requesting refill for 90 days to be sent to [facility] on [address]. This request was given to Dr Darnelle Catalan and he approved for Xarelto 20 mg daily #90 x4 refills. This has been sent to [facility] on [address]. Pt is aware.

## 2020-03-17 ENCOUNTER — Ambulatory Visit: Payer: BC Managed Care – PPO | Admitting: Hematology and Oncology

## 2020-04-27 NOTE — Progress Notes (Signed)
   Covid-19 Vaccination Clinic  Name:  Laquan Beier    MRN: [id number] DOB: 03-20-1961  04/27/2020  Mr. Dougal was observed post Covid-19 immunization for 15 minutes without incident. He was provided with Vaccine Information Sheet and instruction to access the V-Safe system.   Mr. Ake was instructed to call 911 with any severe reactions post vaccine: Marland Kitchen Difficulty breathing  . Swelling of face and throat  . A fast heartbeat  . A bad rash all over body  . Dizziness and weakness   Immunizations Administered    No immunizations on file.

## 2020-05-18 ENCOUNTER — Other Ambulatory Visit: Payer: Self-pay

## 2020-05-18 ENCOUNTER — Inpatient Hospital Stay: Payer: BC Managed Care – PPO | Attending: Hematology and Oncology

## 2020-05-18 ENCOUNTER — Telehealth: Payer: Self-pay

## 2020-05-18 DIAGNOSIS — Z79899 Other long term (current) drug therapy: Secondary | ICD-10-CM | POA: Diagnosis not present

## 2020-05-18 DIAGNOSIS — I2699 Other pulmonary embolism without acute cor pulmonale: Secondary | ICD-10-CM

## 2020-05-18 DIAGNOSIS — Z86718 Personal history of other venous thrombosis and embolism: Secondary | ICD-10-CM | POA: Diagnosis not present

## 2020-05-18 DIAGNOSIS — Z7901 Long term (current) use of anticoagulants: Secondary | ICD-10-CM | POA: Insufficient documentation

## 2020-05-18 DIAGNOSIS — I824Z1 Acute embolism and thrombosis of unspecified deep veins of right distal lower extremity: Secondary | ICD-10-CM

## 2020-05-18 DIAGNOSIS — Z7984 Long term (current) use of oral hypoglycemic drugs: Secondary | ICD-10-CM | POA: Diagnosis not present

## 2020-05-18 DIAGNOSIS — D6852 Prothrombin gene mutation: Secondary | ICD-10-CM | POA: Diagnosis not present

## 2020-05-18 LAB — CBC WITH DIFFERENTIAL (CANCER CENTER ONLY)
Abs Immature Granulocytes: 0.05 10*3/uL (ref 0.00–0.07)
Basophils Absolute: 0.1 10*3/uL (ref 0.0–0.1)
Basophils Relative: 1 %
Eosinophils Absolute: 0.9 10*3/uL — ABNORMAL HIGH (ref 0.0–0.5)
Eosinophils Relative: 12 %
HCT: 39.2 % (ref 39.0–52.0)
Hemoglobin: 13.4 g/dL (ref 13.0–17.0)
Immature Granulocytes: 1 %
Lymphocytes Relative: 31 %
Lymphs Abs: 2.3 10*3/uL (ref 0.7–4.0)
MCH: 29.2 pg (ref 26.0–34.0)
MCHC: 34.2 g/dL (ref 30.0–36.0)
MCV: 85.4 fL (ref 80.0–100.0)
Monocytes Absolute: 0.6 10*3/uL (ref 0.1–1.0)
Monocytes Relative: 8 %
Neutro Abs: 3.5 10*3/uL (ref 1.7–7.7)
Neutrophils Relative %: 47 %
Platelet Count: 159 10*3/uL (ref 150–400)
RBC: 4.59 MIL/uL (ref 4.22–5.81)
RDW: 13.4 % (ref 11.5–15.5)
WBC Count: 7.4 10*3/uL (ref 4.0–10.5)
nRBC: 0 % (ref 0.0–0.2)

## 2020-05-18 LAB — IRON AND TIBC
Iron: 66 ug/dL (ref 42–163)
Saturation Ratios: 21 % (ref 20–55)
TIBC: 318 ug/dL (ref 202–409)
UIBC: 252 ug/dL (ref 117–376)

## 2020-05-18 LAB — LDL CHOLESTEROL, DIRECT: LDL Direct: 65 mg/dL (ref 0–99)

## 2020-05-18 LAB — LIPID PANEL
Chol/HDL Ratio: 6.4 ratio — ABNORMAL HIGH (ref 0.0–5.0)
Cholesterol, Total: 153 mg/dL (ref 100–199)
HDL: 24 mg/dL — ABNORMAL LOW (ref >39)
LDL Chol Calc (NIH): 63 mg/dL (ref 0–99)
Triglycerides: 430 mg/dL — ABNORMAL HIGH (ref 0–149)
VLDL Cholesterol Cal: 66 mg/dL — ABNORMAL HIGH (ref 5–40)

## 2020-05-18 LAB — FERRITIN: Ferritin: 352 ng/mL — ABNORMAL HIGH (ref 24–336)

## 2020-05-18 NOTE — Telephone Encounter (Addendum)
Received this message from [facility] when asked if pt has started cpap: "He has not been set up. He does not qualify for the [place] and his AHI is on the lower end, so we are still waiting on a machine for him, I'm sorry."  I called pt to discuss. He reports that he is in a meeting and will call me back. When pt calls back please ask him if he would like to delay his appt tomorrow with Dr. Frances Furbish until after he starts cpap. If so, please postpone his appt for 2-3 months.

## 2020-05-19 ENCOUNTER — Ambulatory Visit: Payer: BC Managed Care – PPO | Admitting: Neurology

## 2020-05-19 LAB — LUPUS ANTICOAGULANT PANEL
DRVVT: 42.6 s (ref 0.0–47.0)
PTT Lupus Anticoagulant: 32.6 s (ref 0.0–51.9)

## 2020-05-19 LAB — CARDIOLIPIN ANTIBODIES, IGG, IGM, IGA
Anticardiolipin IgA: <9 APL U/mL (ref 0–11)
Anticardiolipin IgG: <9 GPL U/mL (ref 0–14)
Anticardiolipin IgM: 9 MPL U/mL (ref 0–12)

## 2020-05-19 NOTE — Assessment & Plan Note (Signed)
Patient was complaining of left leg pain and ultrasound of the leg revealed extensive DVT involving the femoral-popliteal gastrocnemius and soleus veins  Lupus anticoagulant test: Positive Repeat testing: Negative  Hemochromatosis: Gets phlebotomy every 2 months at [facility]. Hb 13.4, Iron Sat:  24%, ferritin 352  Return to clinic in 3 months with labs done ahead of time and virtual visit follow-up.

## 2020-05-19 NOTE — Assessment & Plan Note (Signed)
VQ scan 02/16/2020: Acute multifocal right lung pulmonary embolism with suspicion of right upper lobe right middle lobe and right lower lobe pulmonary artery occlusion Current treatment: Xarelto

## 2020-05-19 NOTE — Progress Notes (Signed)
Patient Care Team: Alysia Penna, MD as PCP - General (Internal Medicine)  DIAGNOSIS:    ICD-10-CM   1. Hereditary hemochromatosis (HCC)  E83.110 CBC with Differential (Cancer Center Only)    Iron and TIBC    Ferritin  2. Acute pulmonary embolism without acute cor pulmonale, unspecified pulmonary embolism type (HCC)  I26.99 VAS [place] LOWER EXTREMITY VENOUS (DVT)    Lupus anticoagulant panel    Prothrombin gene mutation    Cardiolipin antibodies, IgG, IgM, IgA*  3. Lower leg DVT (deep venous thromboembolism), acute, right (HCC)  I82.4Z1     CHIEF COMPLIANT: Follow-up of DVT and PE  INTERVAL HISTORY: Jesse Hall is a 60 y.o. with above-mentioned history of left leg DVT and right lung pulmonary embolism currently on anticoagulation with Xarelto. He presents to the clinic today for follow-up.  He is tolerating Xarelto extremely well without any problems or concerns.  ALLERGIES:  has No Known Allergies.  MEDICATIONS:  Current Outpatient Medications  Medication Sig Dispense Refill  . allopurinol (ZYLOPRIM) 300 MG tablet Take 300 mg by mouth daily.    Marland Kitchen b complex vitamins tablet Take 1 tablet by mouth daily.    . fenofibrate micronized (LOFIBRA) 134 MG capsule TAKE 1 CAPSULE BY MOUTH ONCE DAILY WITH A MEAL 90 capsule 3  . losartan (COZAAR) 100 MG tablet Take 100 mg by mouth daily.    . metFORMIN (GLUCOPHAGE) 500 MG tablet Take 1,000 mg by mouth 2 (two) times daily with a meal.    . potassium citrate (UROCIT-K) 10 MEQ (1080 MG) SR tablet Take 10 mEq by mouth 2 (two) times daily.    . rivaroxaban (XARELTO) 20 MG TABS tablet Take 1 tablet (20 mg total) by mouth daily. 90 tablet 4  . sitaGLIPtin (JANUVIA) 100 MG tablet Take 100 mg by mouth daily.    Marland Kitchen VASCEPA 1 g capsule TAKE 2 CAPSULES (2 G TOTAL) BY MOUTH 2 (TWO) TIMES DAILY. 360 capsule 3  . VITAMIN D PO Take 1 tablet by mouth daily.     No current facility-administered medications for this visit.    PHYSICAL EXAMINATION: ECOG  PERFORMANCE STATUS: 1 - Symptomatic but completely ambulatory  There were no vitals filed for this visit. There were no vitals filed for this visit.  LABORATORY DATA:  I have reviewed the data as listed CMP Latest Ref Rng & Units 02/10/2020 09/28/2014  Glucose 70 - 99 mg/dL 170(Y) 174(B)  BUN 6 - 20 mg/dL 44(H) 67(R)  Creatinine 0.61 - 1.24 mg/dL 9.16(B) 8.46(K)  Sodium 135 - 145 mmol/L 139 135  Potassium 3.5 - 5.1 mmol/L 4.4 4.6  Chloride 98 - 111 mmol/L 106 101  CO2 22 - 32 mmol/L 25 20(L)  Calcium 8.9 - 10.3 mg/dL 9.5 9.2  Total Protein 6.5 - 8.1 g/dL 7.6 7.4  Total Bilirubin 0.3 - 1.2 mg/dL 1.1 5.9(D)  Alkaline Phos 38 - 126 U/L 44 48  AST 15 - 41 U/L 28 34  ALT 0 - 44 U/L 33 48    Lab Results  Component Value Date   WBC 7.4 05/18/2020   HGB 13.4 05/18/2020   HCT 39.2 05/18/2020   MCV 85.4 05/18/2020   PLT 159 05/18/2020   NEUTROABS 3.5 05/18/2020    ASSESSMENT & PLAN:  Acute pulmonary embolism (HCC) VQ scan 02/16/2020: Acute multifocal right lung pulmonary embolism with suspicion of right upper lobe right middle lobe and right lower lobe pulmonary artery occlusion Current treatment: Xarelto  Lower leg DVT (deep  venous thromboembolism), acute, right [facility]) Patient was complaining of left leg pain and ultrasound of the leg revealed extensive DVT involving the femoral-popliteal gastrocnemius and soleus veins  Lupus anticoagulant test: Positive Repeat testing: Negative Duration of anticoagulation: 1 year  Hemochromatosis: Gets phlebotomy every 2 months at [facility] started September 2021, reduce frequency to every 3-4 months January 2022 05/18/2020: Hb 13.4, Iron Sat:  24%, ferritin 352 (decreased from 965 in October 2021) Recommended that he reduce his phlebotomies to every 3 to 4 months  Return to clinic in October after recheck of ultrasound of lower extremities and blood work with a Control and instrumentation engineer visit.  Total time spent: 30 mins including face to face time and  time spent for planning, charting and coordination of care   Orders Placed This Encounter  Procedures  . Lupus anticoagulant panel    Standing Status:   Future    Standing Expiration Date:   05/20/2021  . Prothrombin gene mutation    Standing Status:   Future    Standing Expiration Date:   05/20/2021  . Cardiolipin antibodies, IgG, IgM, IgA*    Standing Status:   Future    Standing Expiration Date:   05/20/2021  . CBC with Differential (Cancer Center Only)    Standing Status:   Future    Standing Expiration Date:   05/20/2021  . Iron and TIBC    Standing Status:   Future    Standing Expiration Date:   05/20/2021  . Ferritin    Standing Status:   Future    Standing Expiration Date:   05/20/2021   The patient has a good understanding of the overall plan. he agrees with it. he will call with any problems that may develop before the next visit here.  Total time spent: 20 mins including face to face time and time spent for planning, charting and coordination of care  Serena Croissant, MD 05/20/2020  I, Kirt Boys Dorshimer, am acting as scribe for Dr. Serena Croissant.  I have reviewed the above documentation for accuracy and completeness, and I agree with the above.

## 2020-05-20 ENCOUNTER — Inpatient Hospital Stay (HOSPITAL_BASED_OUTPATIENT_CLINIC_OR_DEPARTMENT_OTHER): Payer: BC Managed Care – PPO | Admitting: Hematology and Oncology

## 2020-05-20 DIAGNOSIS — I2699 Other pulmonary embolism without acute cor pulmonale: Secondary | ICD-10-CM | POA: Diagnosis not present

## 2020-05-20 DIAGNOSIS — I824Z1 Acute embolism and thrombosis of unspecified deep veins of right distal lower extremity: Secondary | ICD-10-CM

## 2020-05-25 ENCOUNTER — Telehealth: Payer: Self-pay | Admitting: Hematology and Oncology

## 2020-05-25 NOTE — Telephone Encounter (Signed)
Scheduled per 1/7 los. Called pt and left a msg  

## 2020-05-26 ENCOUNTER — Telehealth (INDEPENDENT_AMBULATORY_CARE_PROVIDER_SITE_OTHER): Payer: BC Managed Care – PPO | Admitting: Internal Medicine

## 2020-05-26 ENCOUNTER — Encounter: Payer: Self-pay | Admitting: Internal Medicine

## 2020-05-26 VITALS — Ht 74.0 in | Wt 291.0 lb

## 2020-05-26 DIAGNOSIS — Z8249 Family history of ischemic heart disease and other diseases of the circulatory system: Secondary | ICD-10-CM | POA: Diagnosis not present

## 2020-05-26 DIAGNOSIS — R931 Abnormal findings on diagnostic imaging of heart and coronary circulation: Secondary | ICD-10-CM

## 2020-05-26 DIAGNOSIS — Z86718 Personal history of other venous thrombosis and embolism: Secondary | ICD-10-CM

## 2020-05-26 DIAGNOSIS — E781 Pure hyperglyceridemia: Secondary | ICD-10-CM | POA: Diagnosis not present

## 2020-05-26 NOTE — Progress Notes (Addendum)
Virtual Visit via Video Note   This visit type was conducted due to national recommendations for restrictions regarding the COVID-19 Pandemic (e.g. social distancing) in an effort to limit this patient's exposure and mitigate transmission in our community.  Due to his co-morbid illnesses, this patient is at least at moderate risk for complications without adequate follow up.  This format is felt to be most appropriate for this patient at this time.  All issues noted in this document were discussed and addressed.  A limited physical exam was performed with this format.  Please refer to the patient's chart for his consent to telehealth for [facility].      Date:  05/26/2020   ID:  Jesse Hall, DOB 1960/12/11, MRN [id number] The patient was identified using 2 identifiers.  Evaluation Performed:  Follow-Up Visit  Patient Location:  [address] [place] [address]  Provider location:   [address], [address] [place], [place] [address]  PCP:  Alysia Penna, MD  Cardiologist:  No primary care provider on file. Electrophysiologist:  None   Chief Complaint:  Follow-up high triglycerides  History of Present Illness:    Jesse Hall is a 60 y.o. male who presents via audio/video conferencing for a telehealth visit today.   This is a pleasant 60 year old male who is referred by Dr. Link Snuffer for high triglycerides.  He has past medical history significant for diabetes however this is only in the past several years.  He has otherwise a longstanding history of high triglycerides for more than 20 years.  This complicated by obesity, hypertension, some degree of kidney dysfunction with kidney stones, OSA on CPAP, and a family history of heart disease on his mother side.  Most recently his lipid profile showed a total cholesterol 170 with triglycerides of 1001 and HDL 25.  LDL was not calculated.  Previously was taking fenofibrate 145 mg daily however has been off of it for few  weeks as he says it causes him some cramping and unusual side effects.  He had also been briefly trialed on Vascepa in the past.  According to him he took it for a week and did not have significant improvement in his numbers and therefore it was stopped, however it may have just been samples.  Is not clear to me whether he had a significant amount of time on the medication to determine a benefit.  Symptomatically he reports being very fatigued.  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  When he gets some rest and sleep he seems to do better.  He denies any overt chest pain.  03/25/2019  Jesse Hall returns today for follow-up of his elevated triglycerides.  I am pleased to report a significant improvement in his numbers.  His total cholesterol today is now 164, triglycerides are 505 (reduced from 1001), HDL 27 and LDL is 60.  He did undergo coronary artery calcium scoring which was 100, this is 75th percentile based on age and sex matched controls.  Aggressive therapy is recommended given his history of diabetes as well.  His target LDL is less than 70 which she is achieved and he has had significant reduction in his triglycerides more than 50% after adjusting his medications.  He seems to be tolerating them well additionally.  He started to exercise a little more regularly walking at least a couple miles at a fast pace  several days a week.  He does report he continues to be very stressed at his job.  12/02/2019  Jesse Hall is seen today in follow-up.  He had an initial excellent response to Vascepa and fenofibrate with improvement in triglycerides down to 505 however recent repeat lab work shows an increase in triglycerides now up to almost 1000 (998).  He says he is actually change his diet more significantly, reducing carbohydrates and lost 7 pounds.  Daily fasting blood sugars are better as  well.  05/26/2020  Jesse Hall has had improvement in his triglycerides now down to and his most recent labs from 998 about 5 months ago.  LDL 63 with total 153.  Unfortunately he suffered significant left lower extremity DVT in September/October 2021 and has been on Xarelto.  Initial testing was positive for lupus anticoagulant however subsequent testing was negative.  He is followed by Dr. Pamelia Hoit.  He reports has been fairly sedentary over the past several months due to his DVT but hopefully increased activity will also lower his triglycerides further.  I would like to refer him on for evaluation for the CORE trial, which is an apoC3 inhibitor that significantly lowers triglycerides. He has not been on statin therapy due to low LDL at goal.  The patient does not have symptoms concerning for COVID-19 infection (fever, chills, cough, or new SHORTNESS OF BREATH).    Prior CV studies:   The following studies were reviewed today:  Chart reviewed  PMHx:  Past Medical History:  Diagnosis Date  . Diabetes mellitus without complication (HCC)   . High cholesterol   . Hypertension   . Kidney failure   . Kidney stones     Past Surgical History:  Procedure Laterality Date  . kidney stone removal      FAMHx:  Family History  Problem Relation Age of Onset  . CAD Mother     SOCHx:   reports that he has never smoked. He has never used smokeless tobacco. He reports that he does not drink alcohol. No history on file for drug use.  ALLERGIES:  No Known Allergies  MEDS:  Current Meds  Medication Sig  . allopurinol (ZYLOPRIM) 300 MG tablet Take 300 mg by mouth daily.  Marland Kitchen b complex vitamins tablet Take 1 tablet by mouth daily.  . fenofibrate micronized (LOFIBRA) 134 MG capsule TAKE 1 CAPSULE BY MOUTH ONCE DAILY WITH A MEAL  . losartan (COZAAR) 100 MG tablet Take 100 mg by mouth daily.  . metFORMIN (GLUCOPHAGE) 500 MG tablet Take 1,000 mg by mouth 2 (two) times daily with a meal.  .  potassium citrate (UROCIT-K) 10 MEQ (1080 MG) SR tablet Take 10 mEq by mouth 2 (two) times daily.  . rivaroxaban (XARELTO) 20 MG TABS tablet Take 1 tablet (20 mg total) by mouth daily.  . sitaGLIPtin (JANUVIA) 100 MG tablet Take 100 mg by mouth daily.  Marland Kitchen VASCEPA 1 g capsule TAKE 2 CAPSULES (2 G TOTAL) BY MOUTH 2 (TWO) TIMES DAILY.  Marland Kitchen VITAMIN D PO Take 1 tablet by mouth daily.     ROS: Pertinent items noted in HPI and remainder of comprehensive ROS otherwise negative.  Labs/Other Tests and Data Reviewed:    Recent Labs: 02/10/2020: ALT 33; BUN 28; Creatinine 2.30; Potassium 4.4; Sodium 139 05/18/2020: Hemoglobin 13.4; Platelet Count 159   Recent Lipid Panel Lab Results  Component Value Date/Time   CHOL 153 05/18/2020 09:00 AM   TRIG 430 (H) 05/18/2020 09:00 AM  HDL 24 (L) 05/18/2020 09:00 AM   CHOLHDL 6.4 (H) 05/18/2020 09:00 AM   LDLCALC 63 05/18/2020 09:00 AM   LDLDIRECT 65 05/18/2020 09:00 AM    Wt Readings from Last 3 Encounters:  05/26/20 291 lb (132 kg)  02/10/20 287 lb 4.8 oz (130.3 kg)  02/09/20 288 lb 1.6 oz (130.7 kg)     Exam:    Vital Signs:  Ht 6\' 2"  (1.88 m)   Wt 291 lb (132 kg)   BMI 37.36 kg/m    General appearance: alert and no distress Lungs: no visual respiratory difficulty Abdomen: obese Extremities: edema LLE swelling Skin: Skin color, texture, turgor normal. No rashes or lesions Neurologic: Grossly normal Psych: Pleasant  ASSESSMENT & PLAN:    1. Primary hypertriglyceridemia 2. Elevated CAC score of 100 3. Morbid obesity 4. Type 2 diabetes  Jesse Hall has had improvement in his triglycerides now down to and his most recent labs from 998 about 5 months ago.  LDL 63 with total 153.  Unfortunately he suffered significant left lower extremity DVT in September/October 2021 and has been on Xarelto.  Initial testing was positive for lupus anticoagulant however subsequent testing was negative.  He is followed by Dr. November 2021.  He reports has been  fairly sedentary over the past several months due to his DVT but hopefully increased activity will also lower his triglycerides further.  I would like to refer him on for evaluation for the CORE trial, which is an apoC3 inhibitor that significantly lowers triglycerides. He would need to be treated with a statin to qualify for the trial. Recommend adding low dose Crestor 5 mg daily - repeat lipids in 3 months - if trigs remain high, then refer to CORE trial.  COVID-19 Education: The signs and symptoms of COVID-19 were discussed with the patient and how to seek care for testing (follow up with PCP or arrange E-visit).  The importance of social distancing was discussed today.  Patient Risk:   After full review of this patients clinical status, I feel that they are at least moderate risk at this time.  Time:   Today, I have spent 15 minutes with the patient with telehealth technology discussing dyslipidemia, recent DVT, research trials.     Medication Adjustments/Labs and Tests Ordered: Current medicines are reviewed at length with the patient today.  Concerns regarding medicines are outlined above.   Tests Ordered: No orders of the defined types were placed in this encounter.   Medication Changes: No orders of the defined types were placed in this encounter.   Disposition:  in 6 month(s)  Pamelia Hoit, MD, [facility], FACP  [place], [place]  [facility]  Medical Director of the Advanced Lipid Disorders &  Cardiovascular Risk Reduction Clinic Diplomate of the American Board of Clinical Lipidology Attending Cardiologist  Direct Dial: [phone]  Fax: [phone]  Website:  [email or web address]  [phone], MD  05/26/2020 8:27 AM

## 2020-05-26 NOTE — Patient Instructions (Signed)
Medication Instructions:  Your physician recommends that you continue on your current medications as directed. Please refer to the Current Medication list given to you today.  *If you need a refill on your cardiac medications before your next appointment, please call your pharmacy*   Lab Work: FASTING lipid panel in 6 months -- please complete about 1 week before your next visit  If you have labs (blood work) drawn today and your tests are completely normal, you will receive your results only by: Marland Kitchen MyChart Message (if you have MyChart) OR . A paper copy in the mail If you have any lab test that is abnormal or we need to change your treatment, we will call you to review the results.   Testing/Procedures: NONE   Follow-Up: At [facility], you and your health needs are our priority.  As part of our continuing mission to provide you with exceptional heart care, we have created designated Provider Care Teams.  These Care Teams include your primary Cardiologist (physician) and Advanced Practice Providers (APPs -  Physician Assistants and Nurse Practitioners) who all work together to provide you with the care you need, when you need it.  We recommend signing up for the patient portal called "MyChart".  Sign up information is provided on this After Visit Summary.  MyChart is used to connect with patients for Virtual Visits (Telemedicine).  Patients are able to view lab/test results, encounter notes, upcoming appointments, etc.  Non-urgent messages can be sent to your provider as well.   To learn more about what you can do with MyChart, go to [email or web address].    Your next appointment:   6 month(s)  The format for your next appointment:   In Person  Provider:   K. Italy Hilty, MD   Other Instructions Dr. Rennis Golden will reach out to the research study coordinators about clinical trial options

## 2020-08-09 ENCOUNTER — Inpatient Hospital Stay: Payer: BC Managed Care – PPO | Attending: Hematology and Oncology

## 2020-08-09 ENCOUNTER — Other Ambulatory Visit: Payer: Self-pay

## 2020-08-09 DIAGNOSIS — Z566 Other physical and mental strain related to work: Secondary | ICD-10-CM | POA: Insufficient documentation

## 2020-08-09 DIAGNOSIS — Z86718 Personal history of other venous thrombosis and embolism: Secondary | ICD-10-CM | POA: Insufficient documentation

## 2020-08-09 DIAGNOSIS — I2699 Other pulmonary embolism without acute cor pulmonale: Secondary | ICD-10-CM

## 2020-08-09 DIAGNOSIS — R635 Abnormal weight gain: Secondary | ICD-10-CM | POA: Diagnosis not present

## 2020-08-09 LAB — IRON AND TIBC
Iron: 79 ug/dL (ref 42–163)
Saturation Ratios: 22 % (ref 20–55)
TIBC: 351 ug/dL (ref 202–409)
UIBC: 272 ug/dL (ref 117–376)

## 2020-08-09 LAB — CBC WITH DIFFERENTIAL (CANCER CENTER ONLY)
Abs Immature Granulocytes: 0.06 10*3/uL (ref 0.00–0.07)
Basophils Absolute: 0.1 10*3/uL (ref 0.0–0.1)
Basophils Relative: 1 %
Eosinophils Absolute: 0.4 10*3/uL (ref 0.0–0.5)
Eosinophils Relative: 5 %
HCT: 39.8 % (ref 39.0–52.0)
Hemoglobin: 13.9 g/dL (ref 13.0–17.0)
Immature Granulocytes: 1 %
Lymphocytes Relative: 28 %
Lymphs Abs: 2.1 10*3/uL (ref 0.7–4.0)
MCH: 29.6 pg (ref 26.0–34.0)
MCHC: 34.9 g/dL (ref 30.0–36.0)
MCV: 84.9 fL (ref 80.0–100.0)
Monocytes Absolute: 0.6 10*3/uL (ref 0.1–1.0)
Monocytes Relative: 8 %
Neutro Abs: 4.2 10*3/uL (ref 1.7–7.7)
Neutrophils Relative %: 57 %
Platelet Count: 178 10*3/uL (ref 150–400)
RBC: 4.69 MIL/uL (ref 4.22–5.81)
RDW: 13.3 % (ref 11.5–15.5)
WBC Count: 7.4 10*3/uL (ref 4.0–10.5)
nRBC: 0 % (ref 0.0–0.2)

## 2020-08-09 LAB — FERRITIN: Ferritin: 251 ng/mL (ref 24–336)

## 2020-08-11 ENCOUNTER — Other Ambulatory Visit: Payer: Self-pay

## 2020-08-11 ENCOUNTER — Inpatient Hospital Stay: Payer: BC Managed Care – PPO | Admitting: Hematology and Oncology

## 2020-08-11 ENCOUNTER — Ambulatory Visit: Payer: BC Managed Care – PPO | Admitting: Hematology and Oncology

## 2020-08-11 VITALS — BP 135/72 | HR 88 | Temp 97.5°F | Resp 18 | Ht 74.0 in | Wt 303.6 lb

## 2020-08-11 DIAGNOSIS — I2699 Other pulmonary embolism without acute cor pulmonale: Secondary | ICD-10-CM

## 2020-08-11 DIAGNOSIS — R079 Chest pain, unspecified: Secondary | ICD-10-CM

## 2020-08-11 DIAGNOSIS — I824Z1 Acute embolism and thrombosis of unspecified deep veins of right distal lower extremity: Secondary | ICD-10-CM | POA: Diagnosis not present

## 2020-08-11 LAB — CARDIOLIPIN ANTIBODIES, IGG, IGM, IGA
Anticardiolipin IgA: <9 APL U/mL (ref 0–11)
Anticardiolipin IgG: <9 GPL U/mL (ref 0–14)
Anticardiolipin IgM: <9 MPL U/mL (ref 0–12)

## 2020-08-11 NOTE — Progress Notes (Signed)
Patient Care Team: Alysia Penna, MD as PCP - General (Internal Medicine)  DIAGNOSIS:  Encounter Diagnoses  Name Primary?  . Chest pain, unspecified type   . Acute pulmonary embolism without acute cor pulmonale, unspecified pulmonary embolism type (HCC) Yes  . Lower leg DVT (deep venous thromboembolism), acute, right (HCC)   . Hereditary hemochromatosis (HCC)      CHIEF COMPLIANT: Follow-up of her hemochromatosis and history of PE and DVT  INTERVAL HISTORY: Jesse Hall is a 60 year old who was diagnosed with PE and DVT in October 2021.  Initial testing showed positive antiphospholipid antibody but repeat testing was negative.  So the plan was to treat him with anticoagulation for 1 year.  He also was diagnosed with hereditary hemochromatosis and he gives a blood transfusion of [facility] every 52months.  He enjoys giving blood and wants to continue with every 2 months.  His lab work done recently showed remarkable improvement in ferritin levels. Unfortunately because of work stressors he has been gaining significant amount of weight.  I discussed with him that weight is risk factor for blood clots and that he needs to lose weight.   ALLERGIES:  has No Known Allergies.  MEDICATIONS:  Current Outpatient Medications  Medication Sig Dispense Refill  . allopurinol (ZYLOPRIM) 300 MG tablet Take 300 mg by mouth daily.    Marland Kitchen b complex vitamins tablet Take 1 tablet by mouth daily.    . fenofibrate micronized (LOFIBRA) 134 MG capsule TAKE 1 CAPSULE BY MOUTH ONCE DAILY WITH A MEAL 90 capsule 3  . losartan (COZAAR) 100 MG tablet Take 100 mg by mouth daily.    . metFORMIN (GLUCOPHAGE) 500 MG tablet Take 1,000 mg by mouth 2 (two) times daily with a meal.    . potassium citrate (UROCIT-K) 10 MEQ (1080 MG) SR tablet Take 10 mEq by mouth 2 (two) times daily.    . rivaroxaban (XARELTO) 20 MG TABS tablet Take 1 tablet (20 mg total) by mouth daily. 90 tablet 4  . sitaGLIPtin (JANUVIA) 100 MG  tablet Take 100 mg by mouth daily.    Marland Kitchen VASCEPA 1 g capsule TAKE 2 CAPSULES (2 G TOTAL) BY MOUTH 2 (TWO) TIMES DAILY. 360 capsule 3  . VITAMIN D PO Take 1 tablet by mouth daily.     No current facility-administered medications for this visit.    PHYSICAL EXAMINATION: ECOG PERFORMANCE STATUS: 1 - Symptomatic but completely ambulatory  Vitals:   08/11/20 0855  BP: 135/72  Pulse: 88  Resp: 18  Temp: (!) 97.5 F (36.4 C)  SpO2: 97%   Filed Weights   08/11/20 0855  Weight: (!) 303 lb 9.6 oz (137.7 kg)     LABORATORY DATA:  I have reviewed the data as listed CMP Latest Ref Rng & Units 02/10/2020 09/28/2014  Glucose 70 - 99 mg/dL 400(Q) 676(P)  BUN 6 - 20 mg/dL 95(K) 93(O)  Creatinine 0.61 - 1.24 mg/dL 6.71(I) 4.58(K)  Sodium 135 - 145 mmol/L 139 135  Potassium 3.5 - 5.1 mmol/L 4.4 4.6  Chloride 98 - 111 mmol/L 106 101  CO2 22 - 32 mmol/L 25 20(L)  Calcium 8.9 - 10.3 mg/dL 9.5 9.2  Total Protein 6.5 - 8.1 g/dL 7.6 7.4  Total Bilirubin 0.3 - 1.2 mg/dL 1.1 9.9(I)  Alkaline Phos 38 - 126 U/L 44 48  AST 15 - 41 U/L 28 34  ALT 0 - 44 U/L 33 48    Lab Results  Component Value Date  WBC 7.4 08/09/2020   HGB 13.9 08/09/2020   HCT 39.8 08/09/2020   MCV 84.9 08/09/2020   PLT 178 08/09/2020   NEUTROABS 4.2 08/09/2020    ASSESSMENT & PLAN:  Acute pulmonary embolism (HCC) VQ scan 02/16/2020: Acute multifocal right lung pulmonary embolism with suspicion of right upper lobe right middle lobe and right lower lobe pulmonary artery occlusion Current treatment: Xarelto  Lower leg DVT (deep venous thromboembolism), acute, right (HCC) Patient was complaining of left leg pain and ultrasound of the leg revealed extensive DVT involving the femoral-popliteal gastrocnemius and soleus veins  Lupus anticoagulant test: Positive Repeat testing: Negative Duration of anticoagulation: 1 year  We will repeat a VQ scan and ultrasound of the right leg for DVT in October and make a decision  regarding discontinuation of anticoagulation. If he continues to be obese, it may be necessary to extend the duration of blood thinners.  Hereditary hemochromatosis (HCC) Gets phlebotomy every 2 months at [facility] started September 2021, he donates blood every 56 days. 05/18/2020: Hb 13.4, Iron Sat:  24%, ferritin 352 (decreased from 965 in October 2021) 08/09/2020: Hemoglobin 13.9, iron saturation 22%, ferritin 251  He will continue his phlebotomies as he is currently doing.  I discussed with him that he could decrease the phlebotomies to every 3 months.  But he seems to be enjoying the blood donation process and the benefits of donating blood and helping others.  He works at [employer] in the [employer] and is extremely stressed at work and because that he has been gaining weight.  I discussed with him that he needs to figure out a way to focus on his health and lose weight.       Orders Placed This Encounter  Procedures  . NM Pulmonary Perf and Vent    Standing Status:   Future    Standing Expiration Date:   08/11/2021    Order Specific Question:   If indicated for the ordered procedure, I authorize the administration of a radiopharmaceutical per Radiology protocol    Answer:   Yes    Order Specific Question:   Preferred imaging location?    Answer:   [facility]    Order Specific Question:   Release to patient    Answer:   Immediate  . CBC with Differential (Cancer Center Only)    Standing Status:   Future    Standing Expiration Date:   08/11/2021  . Iron and TIBC    Standing Status:   Future    Standing Expiration Date:   08/11/2021  . Ferritin    Standing Status:   Future    Standing Expiration Date:   08/11/2021  . CMP (Cancer Center only)    Standing Status:   Future    Standing Expiration Date:   08/11/2021   The patient has a good understanding of the overall plan. he agrees with it. he will call with any problems that may develop before the next visit  here. Total time spent: 30 mins including face to face time and time spent for planning, charting and co-ordination of care   Tamsen Meek, MD 08/11/20

## 2020-08-11 NOTE — Assessment & Plan Note (Signed)
VQ scan 02/16/2020: Acute multifocal right lung pulmonary embolism with suspicion of right upper lobe right middle lobe and right lower lobe pulmonary artery occlusion Current treatment: Xarelto  Lower leg DVT (deep venous thromboembolism), acute, right (HCC) Patient was complaining of left leg pain and ultrasound of the leg revealed extensive DVT involving the femoral-popliteal gastrocnemius and soleus veins  Lupus anticoagulant test: Positive Repeat testing: Negative Duration of anticoagulation: 1 year  We will repeat a VQ scan and ultrasound of the right leg for DVT in October and make a decision regarding discontinuation of anticoagulation. If he continues to be obese, it may be necessary to extend the duration of blood thinners.

## 2020-08-11 NOTE — Assessment & Plan Note (Signed)
Gets phlebotomy every 2 months at [facility] started September 2021, he donates blood every 56 days. 05/18/2020: Hb 13.4, Iron Sat:  24%, ferritin 352 (decreased from 965 in October 2021) 08/09/2020: Hemoglobin 13.9, iron saturation 22%, ferritin 251  He will continue his phlebotomies as he is currently doing.  I discussed with him that he could decrease the phlebotomies to every 3 months.  But he seems to be enjoying the blood donation process and the benefits of donating blood and helping others.  He works at [employer] in the [employer] and is extremely stressed at work and because that he has been gaining weight.  I discussed with him that he needs to figure out a way to focus on his health and lose weight.

## 2020-08-12 LAB — LUPUS ANTICOAGULANT PANEL
DRVVT: 60.3 s — ABNORMAL HIGH (ref 0.0–47.0)
PTT Lupus Anticoagulant: 43 s (ref 0.0–51.9)

## 2020-08-12 LAB — DRVVT CONFIRM: dRVVT Confirm: 1.3 ratio — ABNORMAL HIGH (ref 0.8–1.2)

## 2020-08-12 LAB — DRVVT MIX: dRVVT Mix: 45.4 s — ABNORMAL HIGH (ref 0.0–40.4)

## 2020-08-15 ENCOUNTER — Ambulatory Visit: Payer: BC Managed Care – PPO | Admitting: Hematology and Oncology

## 2020-08-16 ENCOUNTER — Telehealth: Payer: Self-pay | Admitting: Internal Medicine

## 2020-08-16 NOTE — Telephone Encounter (Signed)
-----   Message from Chrystie Nose, MD sent at 08/12/2020  4:45 PM EDT ----- Regarding: FW: CORE Screening Can we add low dose Crestor 5 mg daily ? LDL is low, but won't qualify for the CORE TG lowering study unless he is on a statin. Repeat lipids in 3 months.  Thanks.  Dr. Rexene Edison  ----- Message ----- From: Orvilla Cornwall, RN Sent: 08/12/2020   2:58 PM EDT To: Chrystie Nose, MD Subject: CORE Screening                                 Hey, Dr. Rennis Golden. I am starting to schedule screenings for CORE. I was looking at Mr. Manuele and I'm not sure that he meets quite yet unless he is intolerant to statins and ezetimibe. Inclusion item #4 says patients should be on standard of care lipid lowering meds per appendix F. Appendix F says they must be on either fibrate or omega 3 fatty acids and statin therapy (unless intolerant) in which case ezetimibe is suggested. I saw he had issues with a few medications but I didn't see where he is intolerant to statins? Maybe I just missed it.     Thanks, Rolly Salter ----- Message ----- From: Chrystie Nose, MD Sent: 12/02/2019  10:03 AM EDT To: Benson Norway, RN Subject: BALANCE add-on                                 Can you maintain Mr. Mclure name - trigs around 1000, no history of pancreatitis - suspect this is not FCS, but he may qualify for the add-on study.  Thanks,  Dr Rexene Edison

## 2020-08-16 NOTE — Telephone Encounter (Signed)
Spoke with patient on 08/15/2020 regarding message as noted below.  He wishes to think about Dr. Blanchie Dessert suggestions and will return call

## 2020-08-17 LAB — PROTHROMBIN GENE MUTATION

## 2020-11-21 ENCOUNTER — Other Ambulatory Visit (HOSPITAL_COMMUNITY): Payer: Self-pay | Admitting: Internal Medicine

## 2020-11-21 DIAGNOSIS — N1831 Chronic kidney disease, stage 3a: Secondary | ICD-10-CM

## 2020-11-29 LAB — LIPID PANEL
Chol/HDL Ratio: 8 ratio — ABNORMAL HIGH (ref 0.0–5.0)
Cholesterol, Total: 184 mg/dL (ref 100–199)
HDL: 23 mg/dL — ABNORMAL LOW (ref >39)
Triglycerides: 875 mg/dL (ref 0–149)

## 2020-11-30 ENCOUNTER — Other Ambulatory Visit: Payer: Self-pay

## 2020-11-30 ENCOUNTER — Ambulatory Visit (HOSPITAL_COMMUNITY)
Admission: RE | Admit: 2020-11-30 | Discharge: 2020-11-30 | Disposition: A | Discharge status: Home / Self Care | Payer: BC Managed Care – PPO | Source: Ambulatory Visit | Attending: Internal Medicine | Admitting: Internal Medicine

## 2020-11-30 DIAGNOSIS — N1831 Chronic kidney disease, stage 3a: Secondary | ICD-10-CM

## 2020-12-01 ENCOUNTER — Encounter (HOSPITAL_BASED_OUTPATIENT_CLINIC_OR_DEPARTMENT_OTHER): Payer: Self-pay | Admitting: Internal Medicine

## 2020-12-01 ENCOUNTER — Ambulatory Visit (HOSPITAL_BASED_OUTPATIENT_CLINIC_OR_DEPARTMENT_OTHER): Payer: BC Managed Care – PPO | Admitting: Internal Medicine

## 2020-12-01 ENCOUNTER — Telehealth: Payer: Self-pay | Admitting: Internal Medicine

## 2020-12-01 VITALS — BP 138/62 | HR 83 | Ht 74.0 in | Wt 307.4 lb

## 2020-12-01 DIAGNOSIS — R931 Abnormal findings on diagnostic imaging of heart and coronary circulation: Secondary | ICD-10-CM

## 2020-12-01 DIAGNOSIS — E119 Type 2 diabetes mellitus without complications: Secondary | ICD-10-CM | POA: Diagnosis not present

## 2020-12-01 DIAGNOSIS — E783 Hyperchylomicronemia: Secondary | ICD-10-CM

## 2020-12-01 DIAGNOSIS — Z8249 Family history of ischemic heart disease and other diseases of the circulatory system: Secondary | ICD-10-CM

## 2020-12-01 MED ORDER — ROSUVASTATIN CALCIUM 5 MG PO TABS: 5.0000 mg | ORAL_TABLET | Freq: Every day | ORAL | 3 refills | Status: DC

## 2020-12-01 NOTE — Patient Instructions (Signed)
Medication Instructions:  START crestor 5mg  daily  *If you need a refill on your cardiac medications before your next appointment, please call your pharmacy*   Lab Work: FASTING lab work in about 6 months to check cholesterol -- complete about 1 week before your next visit with Dr.   If you have labs (blood work) drawn today and your tests are completely normal, you will receive your results only by: MyChart Message (if you have MyChart) OR A paper copy in the mail If you have any lab test that is abnormal or we need to change your treatment, we will call you to review the results.   Testing/Procedures: NONE   Follow-Up: At [facility], you and your health needs are our priority.  As part of our continuing mission to provide you with exceptional heart care, we have created designated Provider Care Teams.  These Care Teams include your primary Cardiologist (physician) and Advanced Practice Providers (APPs -  Physician Assistants and Nurse Practitioners) who all work together to provide you with the care you need, when you need it.  We recommend signing up for the patient portal called "MyChart".  Sign up information is provided on this After Visit Summary.  MyChart is used to connect with patients for Virtual Visits (Telemedicine).  Patients are able to view lab/test results, encounter notes, upcoming appointments, etc.  Non-urgent messages can be sent to your provider as well.   To learn more about what you can do with MyChart, go to [facility].    Your next appointment:   6 month(s)  The format for your next appointment:   In Person  Provider:   [email or web address] Kirtland Bouchard Hilty MD [facility]   Other Instructions Genetic Test completed today -- we will let you know about the results when they return in about 3 weeks  Dr. Italy will submit your name for a clinical trial

## 2020-12-01 NOTE — Progress Notes (Signed)
LIPID CLINIC CONSULT NOTE  Chief Complaint:  Follow-up elevated triglycerides  Primary Care Physician: Alysia Penna, MD  Primary Cardiologist:  None  HPI:  Jesse Hall is a 60 y.o. male who is being seen today for the evaluation of high triglycerides at the request of Alysia Penna, MD.  This is a pleasant 60 year old male who is referred by Dr. Link Snuffer for high triglycerides.  He has past medical history significant for diabetes however this is only in the past several years.  He has otherwise a longstanding history of high triglycerides for more than 20 years.  This complicated by obesity, hypertension, some degree of kidney dysfunction with kidney stones, OSA on CPAP, and a family history of heart disease on his mother side.  Most recently his lipid profile showed a total cholesterol 170 with triglycerides of 1001 and HDL 25.  LDL was not calculated.  Previously was taking fenofibrate 145 mg daily however has been off of it for few weeks as he says it causes him some cramping and unusual side effects.  He had also been briefly trialed on Vascepa in the past.  According to him he took it for a week and did not have significant improvement in his numbers and therefore it was stopped, however it may have just been samples.  Is not clear to me whether he had a significant amount of time on the medication to determine a benefit.  Symptomatically he reports being very fatigued.  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  When he gets some rest and sleep he seems to do better.  He denies any overt chest pain.  03/25/2019  [name] returns today for follow-up of his elevated triglycerides.  I am pleased to report a significant improvement in his numbers.  His total cholesterol today is now 164, triglycerides are 505 (reduced from 1001), HDL 27 and LDL is 60.  He did undergo coronary artery calcium  scoring which was 100, this is 75th percentile based on age and sex matched controls.  Aggressive therapy is recommended given his history of diabetes as well.  His target LDL is less than 70 which she is achieved and he has had significant reduction in his triglycerides more than 50% after adjusting his medications.  He seems to be tolerating them well additionally.  He started to exercise a little more regularly walking at least a couple miles at a fast pace several days a week.  He does report he continues to be very stressed at his job.  12/02/2019  [name] is seen today in follow-up.  He had an initial excellent response to Vascepa and fenofibrate with improvement in triglycerides down to 505 however recent repeat lab work shows an increase in triglycerides now up to almost 1000 (998).  He says he is actually change his diet more significantly, reducing carbohydrates and lost 7 pounds.  Daily fasting blood sugars are better as well.  12/01/2020  [name] returns today for follow-up.  Unfortunately he has been struggling a lot at work.  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  He seemed very tired today in the office.  He says he had difficulty maintaining any kind of regular schedule including eating and working.  Unfortunately his triglycerides have gone up accordingly.  He is compliant with medications.  We had discussed starting rosuvastatin 5 mg daily previously but he never was able to start the medication.  This would be a requirement for him to enroll in the core trial.  Recent cholesterol from 2 days ago showed total 184, triglycerides 875, HDL 23 and LDL was not calculated due to high triglycerides.  His previous triglycerides were 430 but had been as high as 998 about a year ago.  PMHx:  Past Medical History:  Diagnosis Date   Diabetes mellitus without complication (HCC)    High cholesterol    Hypertension    Kidney failure    Kidney  stones     Past Surgical History:  Procedure Laterality Date   kidney stone removal      FAMHx:  Family History  Problem Relation Age of Onset   CAD Mother     SOCHx:   reports that he has never smoked. He has never used smokeless tobacco. He reports that he does not drink alcohol. No history on file for drug use.  ALLERGIES:  No Known Allergies  ROS: Pertinent items noted in HPI and remainder of comprehensive ROS otherwise negative.  HOME MEDS: Current Outpatient Medications on File Prior to Visit  Medication Sig Dispense Refill   allopurinol (ZYLOPRIM) 300 MG tablet Take 300 mg by mouth daily.     b complex vitamins tablet Take 1 tablet by mouth daily.     fenofibrate micronized (LOFIBRA) 134 MG capsule TAKE 1 CAPSULE BY MOUTH ONCE DAILY WITH A MEAL 90 capsule 3   losartan (COZAAR) 100 MG tablet Take 100 mg by mouth daily.     metFORMIN (GLUCOPHAGE) 500 MG tablet Take 1,000 mg by mouth 2 (two) times daily with a meal.     potassium citrate (UROCIT-K) 10 MEQ (1080 MG) SR tablet Take 10 mEq by mouth 2 (two) times daily.     rivaroxaban (XARELTO) 20 MG TABS tablet Take 1 tablet (20 mg total) by mouth daily. 90 tablet 4   sitaGLIPtin (JANUVIA) 100 MG tablet Take 100 mg by mouth daily.     VASCEPA 1 g capsule TAKE 2 CAPSULES (2 G TOTAL) BY MOUTH 2 (TWO) TIMES DAILY. 360 capsule 3   VITAMIN D PO Take 1 tablet by mouth daily.     No current facility-administered medications on file prior to visit.    LABS/IMAGING: No results found for this or any previous visit (from the past 48 hour(s)). US RENAL  Result Date: 11/30/2020 CLINICAL DATA:  Stage 3 chronic kidney disease EXAM: RENAL / URINARY TRACT ULTRASOUND COMPLETE COMPARISON:  CT abdomen pelvis 08/01/2015 FINDINGS: Right Kidney: Renal measurements: 8.7 x 4.0 x 3.7 cm = volume: 68 mL. Mild increased echogenicity of the renal cortex without significant thinning. No mass or hydronephrosis visualized. Left Kidney: Renal  measurements: 15.0 x 6.7 x 6.4 cm = volume: 338 mL. Echogenicity within normal limits. No mass or hydronephrosis visualized. 1.0 cm exophytic simple cyst seen in the mid kidney. Bladder: Appears normal for degree of bladder distention. Other: None. IMPRESSION: Asymmetrically atrophied right kidney again seen. No significant abnormality of the left kidney. Electronically Signed   By: Acquanetta Belling M.D.   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  He does have an elevated calcium score and type 2 diabetes.  We need to continue to be aggressive with his lipids.  He should be on a statin I encouraged him to start rosuvastatin 5 mg daily again.  We talked about the importance of diet and more physical activity.  Stress is also playing a negative role in this.  Weight gain is also been an issue as well.  He was 291 pounds in January now up to 307 pounds.  I would like to refer him for the core trial as this may be helpful if he is randomized to the APO C3 inhibitor.  I will also send a triglyceride genetic panel today as well at his request.  Follow-up with repeat lipids in 6 months.  February, MD, [facility], FACP  [place]  [facility]  Medical Director of the Advanced Lipid Disorders &  Cardiovascular Risk  Reduction Clinic Diplomate of the American Board of Clinical Lipidology Attending Cardiologist  Direct Dial: [phone]  Fax: [phone]  Website:  [phone] Kaileah Shevchenko 12/01/2020, 9:50 AM

## 2020-12-01 NOTE — Telephone Encounter (Signed)
Familial hypertriglyceridemia genetic test (cheek) swab completed. Mailed to [employer] [facility]. Patient aware results should be available in about 3 weeks.

## 2020-12-08 ENCOUNTER — Other Ambulatory Visit: Payer: Self-pay

## 2020-12-08 ENCOUNTER — Ambulatory Visit: Payer: BC Managed Care – PPO | Admitting: Podiatry

## 2020-12-08 DIAGNOSIS — L600 Ingrowing nail: Secondary | ICD-10-CM | POA: Diagnosis not present

## 2020-12-08 NOTE — Progress Notes (Signed)
Subjective:   Patient ID: Jesse Hall, male   DOB: 60 y.o.   MRN: [id number]   HPI Patient presents with a painful ingrown toenail of years duration right big toe towards the second toe.  States he is been trying to trim it and other modalities without relief and has had problems with his left big toenail has had previous ingrown's fixed.  Patient states its been very sore with no active redness or drainage noted and patient does not smoke did have a DVT with PE last year that is resolved and is on Eliquis   Review of Systems  All other systems reviewed and are negative.      Objective:  Physical Exam Vitals and nursing note reviewed.  Constitutional:      Appearance: He is well-developed.  Pulmonary:     Effort: Pulmonary effort is normal.  Musculoskeletal:        General: Normal range of motion.  Skin:    General: Skin is warm.  Neurological:     Mental Status: He is alert.    Neurovascular status found to be intact muscle strength found to be adequate range of motion adequate.  Patient is found to have incurvated lateral border right hallux painful when pressed with nail structural damage.  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  I explained procedure risk patient wants surgery signed consent form and today I infiltrated the right hallux 60 mg like Marcaine mixture sterile prep done and using sterile instrumentation remove the lateral border exposed matrix applied phenol 3 applications 30 seconds followed by alcohol lavage sterile dressing gave instructions on soaks and to leave dressing on  24 hours but take it off earlier if any throbbing were to occur.  Discussed at 1 point future he may lose the entire nail based on its structure but hopefully this will solve his problem

## 2020-12-08 NOTE — Patient Instructions (Signed)

## 2021-01-19 NOTE — Telephone Encounter (Signed)
Genetics report demonstrated NO genetic variants for familial hypertriglyceridemia  Of note, patient has NOT heard from research dept about clinical trial - will notify MD

## 2021-01-20 NOTE — Telephone Encounter (Signed)
Please reach out to Mr. Maniscalco about CORE- if you have not already. He said that he has not been contacted- last trigs were 875 (1 month ago).  Thanks.  Dr. Rennis Golden

## 2021-01-27 NOTE — Telephone Encounter (Signed)
Called pt- he was in a meeting and he is going to [place] on Monday. He asked that I call him back on Wednesday.

## 2021-02-09 ENCOUNTER — Ambulatory Visit (HOSPITAL_COMMUNITY): Payer: BC Managed Care – PPO

## 2021-02-10 ENCOUNTER — Ambulatory Visit (HOSPITAL_COMMUNITY): Payer: BC Managed Care – PPO

## 2021-02-16 ENCOUNTER — Ambulatory Visit (HOSPITAL_COMMUNITY)
Admission: RE | Admit: 2021-02-16 | Discharge: 2021-02-16 | Disposition: A | Discharge status: Home / Self Care | Payer: BC Managed Care – PPO | Source: Ambulatory Visit | Attending: Hematology and Oncology | Admitting: Hematology and Oncology

## 2021-02-16 ENCOUNTER — Other Ambulatory Visit: Payer: Self-pay

## 2021-02-16 DIAGNOSIS — I2699 Other pulmonary embolism without acute cor pulmonale: Secondary | ICD-10-CM | POA: Diagnosis not present

## 2021-02-17 ENCOUNTER — Inpatient Hospital Stay: Payer: BC Managed Care – PPO | Attending: Hematology and Oncology

## 2021-02-17 DIAGNOSIS — I2699 Other pulmonary embolism without acute cor pulmonale: Secondary | ICD-10-CM

## 2021-02-17 DIAGNOSIS — Z86711 Personal history of pulmonary embolism: Secondary | ICD-10-CM | POA: Insufficient documentation

## 2021-02-17 DIAGNOSIS — I824Z1 Acute embolism and thrombosis of unspecified deep veins of right distal lower extremity: Secondary | ICD-10-CM

## 2021-02-17 LAB — CBC WITH DIFFERENTIAL (CANCER CENTER ONLY)
Abs Immature Granulocytes: 0.07 10*3/uL (ref 0.00–0.07)
Basophils Absolute: 0.1 10*3/uL (ref 0.0–0.1)
Basophils Relative: 1 %
Eosinophils Absolute: 0.3 10*3/uL (ref 0.0–0.5)
Eosinophils Relative: 5 %
HCT: 35.4 % — ABNORMAL LOW (ref 39.0–52.0)
Hemoglobin: 12.8 g/dL — ABNORMAL LOW (ref 13.0–17.0)
Immature Granulocytes: 1 %
Lymphocytes Relative: 30 %
Lymphs Abs: 1.9 10*3/uL (ref 0.7–4.0)
MCH: 29.8 pg (ref 26.0–34.0)
MCHC: 36.2 g/dL — ABNORMAL HIGH (ref 30.0–36.0)
MCV: 82.3 fL (ref 80.0–100.0)
Monocytes Absolute: 0.5 10*3/uL (ref 0.1–1.0)
Monocytes Relative: 7 %
Neutro Abs: 3.5 10*3/uL (ref 1.7–7.7)
Neutrophils Relative %: 56 %
Platelet Count: 173 10*3/uL (ref 150–400)
RBC: 4.3 MIL/uL (ref 4.22–5.81)
RDW: 13.6 % (ref 11.5–15.5)
WBC Count: 6.3 10*3/uL (ref 4.0–10.5)
nRBC: 0 % (ref 0.0–0.2)

## 2021-02-17 LAB — CMP (CANCER CENTER ONLY)
ALT: 31 U/L (ref 0–44)
AST: 27 U/L (ref 15–41)
Albumin: 4.1 g/dL (ref 3.5–5.0)
Alkaline Phosphatase: 40 U/L (ref 38–126)
Anion gap: 10 (ref 5–15)
BUN: 26 mg/dL — ABNORMAL HIGH (ref 6–20)
CO2: 19 mmol/L — ABNORMAL LOW (ref 22–32)
Calcium: 9.4 mg/dL (ref 8.9–10.3)
Chloride: 109 mmol/L (ref 98–111)
Creatinine: 2.11 mg/dL — ABNORMAL HIGH (ref 0.61–1.24)
GFR, Estimated: 35 mL/min — ABNORMAL LOW (ref >60)
Glucose, Bld: 252 mg/dL — ABNORMAL HIGH (ref 70–99)
Potassium: 4.7 mmol/L (ref 3.5–5.1)
Sodium: 138 mmol/L (ref 135–145)
Total Bilirubin: 0.7 mg/dL (ref 0.3–1.2)
Total Protein: 7 g/dL (ref 6.5–8.1)

## 2021-02-17 LAB — FERRITIN: Ferritin: 140 ng/mL (ref 24–336)

## 2021-02-17 LAB — IRON AND TIBC
Iron: 91 ug/dL (ref 42–163)
Saturation Ratios: 25 % (ref 20–55)
TIBC: 358 ug/dL (ref 202–409)
UIBC: 267 ug/dL (ref 117–376)

## 2021-02-22 NOTE — Progress Notes (Signed)
HEMATOLOGY-ONCOLOGY MYCHART VIDEO VISIT PROGRESS NOTE  I connected with Jesse Hall on 02/24/2021 at 10:15 AM EDT by MyChart video conference and verified that I am speaking with the correct person using two identifiers.  I discussed the limitations, risks, security and privacy concerns of performing an evaluation and management service by MyChart and the availability of in person appointments.  I also discussed with the patient that there may be a patient responsible charge related to this service. The patient expressed understanding and agreed to proceed.  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 male with above-mentioned history of PE and DVT. She presents via MyChart today for follow-up.   He does not have any problems tolerating the anticoagulation with Xarelto.  In May he was diagnosed with COVID-19 and had a rough several months with mental fogginess but he has recovered from most of those side effects.  Observations/Objective:    I have reviewed the data as listed CMP Latest Ref Rng & Units 02/17/2021 02/10/2020 09/28/2014  Glucose 70 - 99 mg/dL 401(U) 272(Z) 366(Y)  BUN 6 - 20 mg/dL 40(H) 47(Q) 25(Z)  Creatinine 0.61 - 1.24 mg/dL 5.63(O) 7.56(E) 3.32(R)  Sodium 135 - 145 mmol/L 138 139 135  Potassium 3.5 - 5.1 mmol/L 4.7 4.4 4.6  Chloride 98 - 111 mmol/L 109 106 101  CO2 22 - 32 mmol/L 19(L) 25 20(L)  Calcium 8.9 - 10.3 mg/dL 9.4 9.5 9.2  Total Protein 6.5 - 8.1 g/dL 7.0 7.6 7.4  Total Bilirubin 0.3 - 1.2 mg/dL 0.7 1.1 5.1(O)  Alkaline Phos 38 - 126 U/L 40 44 48  AST 15 - 41 U/L 27 28 34  ALT 0 - 44 U/L 31 33 48    Lab Results  Component Value Date   WBC 6.3 02/17/2021   HGB 12.8 (L) 02/17/2021   HCT 35.4 (L) 02/17/2021   MCV 82.3 02/17/2021   PLT 173 02/17/2021   NEUTROABS 3.5 02/17/2021      Assessment Plan:  Acute pulmonary  embolism (HCC) VQ scan 02/16/2020: Acute multifocal right lung pulmonary embolism with suspicion of right upper lobe right middle lobe and right lower lobe pulmonary artery occlusion Current treatment: Xarelto  Lower leg DVT (deep venous thromboembolism), acute, right (HCC) Patient was complaining of left leg pain and ultrasound of the leg revealed extensive DVT involving the femoral-popliteal gastrocnemius and soleus veins   Lupus anticoagulant test: Positive Repeat testing: Negative  Covid inf: May had lots of side effects incl brain fog.  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    Duration of anticoagulation: Long-term We will plan to see him back in 1 year with another check with an ultrasound of the legs.  I recommend that we renew Xarelto when it is due to be renewed for a year.   I discussed the assessment and treatment plan with the patient. The patient was provided an opportunity to ask questions and all were answered. The patient agreed with the plan and demonstrated an understanding of the instructions. The patient was advised to call back or seek an in-person evaluation if the symptoms worsen or if the condition fails to improve as anticipated.   Total time spent: 20 minutes including face-to-face MyChart video visit time and time spent for planning, charting and coordination  of care  Sabas Sous, MD 02/24/2021  I, Alda Ponder am acting as scribe for Serena Croissant, MD.  I have reviewed the above documentation for accuracy and completeness, and I agree with the above.

## 2021-02-23 ENCOUNTER — Telehealth: Payer: Self-pay | Admitting: Neurology

## 2021-02-23 NOTE — Telephone Encounter (Signed)
Clarification: visit is on 05/17/21 at 945.

## 2021-02-23 NOTE — Telephone Encounter (Signed)
Pt was scheduled for Initial CPAP visit on 05/17/20. Pt was informed to bring machine and power cord to appt. DME: [facility] Phone: [phone] Fax: [phone] Equipment issued: AirSense 11 Pt to be scheduled between: 03/25/21-05/23/21

## 2021-02-24 ENCOUNTER — Telehealth (HOSPITAL_BASED_OUTPATIENT_CLINIC_OR_DEPARTMENT_OTHER): Payer: BC Managed Care – PPO | Admitting: Hematology and Oncology

## 2021-02-24 DIAGNOSIS — I824Z1 Acute embolism and thrombosis of unspecified deep veins of right distal lower extremity: Secondary | ICD-10-CM

## 2021-02-24 DIAGNOSIS — I2699 Other pulmonary embolism without acute cor pulmonale: Secondary | ICD-10-CM | POA: Diagnosis not present

## 2021-02-24 NOTE — Assessment & Plan Note (Signed)
VQ scan 02/16/2020: Acute multifocal right lung pulmonary embolism with suspicion of right upper lobe right middle lobe and right lower lobe pulmonary artery occlusion Current treatment: Xarelto

## 2021-02-24 NOTE — Assessment & Plan Note (Signed)
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    Duration of anticoagulation: Long-term

## 2021-02-26 ENCOUNTER — Other Ambulatory Visit: Payer: Self-pay | Admitting: Internal Medicine

## 2021-03-01 ENCOUNTER — Other Ambulatory Visit: Payer: Self-pay | Admitting: Internal Medicine

## 2021-03-16 NOTE — Telephone Encounter (Signed)
Sent pt a mychart message with no response. I called him and he will be out of town for a funeral next week and cannot come in then. We scheduled an appointment for a screening run-in in December. All instructions emailed to the pt with parking code.

## 2021-03-20 ENCOUNTER — Encounter: Payer: Self-pay | Admitting: Hematology and Oncology

## 2021-04-05 ENCOUNTER — Other Ambulatory Visit: Payer: Self-pay | Admitting: Oncology

## 2021-04-18 ENCOUNTER — Telehealth: Payer: Self-pay | Admitting: Neurology

## 2021-04-18 DIAGNOSIS — G4733 Obstructive sleep apnea (adult) (pediatric): Secondary | ICD-10-CM

## 2021-04-18 NOTE — Telephone Encounter (Signed)
LMVM for pt that received message.  Get from your local DME - overnight the mask??  Dr Frances Furbish is out of office at this time will send to [facility].   ? Mask??

## 2021-04-18 NOTE — Telephone Encounter (Signed)
[place] for his CPAP.  Pt is asking if a prescription can be sent to [facility] [phone] fax#[phone]

## 2021-04-18 NOTE — Telephone Encounter (Addendum)
Pt returned call he is in [place], out ot town.  He is asking for an order for cpap mask (nasal mask)  (as he forgot his mask) to be faxed to the DME (whom he has been in contact with - they have supplies to be able to get for him) .  [facility] [phone],   fax#[phone]. I spoke to them,  need order, HST.  Pt has appt 05-17-2021 for f/u with [place].

## 2021-04-19 NOTE — Addendum Note (Signed)
Addended by: Guy Begin on: 04/19/2021 10:04 AM   Modules accepted: Orders

## 2021-04-19 NOTE — Telephone Encounter (Signed)
Pls send requested order for mask.

## 2021-05-17 ENCOUNTER — Ambulatory Visit: Payer: BC Managed Care – PPO | Admitting: Neurology

## 2021-05-25 ENCOUNTER — Telehealth: Payer: Self-pay | Admitting: *Deleted

## 2021-05-25 NOTE — Telephone Encounter (Signed)
Message left on Jesse Hall voicemail to remind him of appointment tomorrow at 0800, being NPO , parking info, and my contact info for any questions.

## 2021-05-26 ENCOUNTER — Encounter: Payer: Self-pay | Admitting: *Deleted

## 2021-05-26 ENCOUNTER — Encounter: Payer: BC Managed Care – PPO | Admitting: *Deleted

## 2021-05-26 ENCOUNTER — Other Ambulatory Visit: Payer: Self-pay

## 2021-05-26 DIAGNOSIS — Z006 Encounter for examination for normal comparison and control in clinical research program: Secondary | ICD-10-CM

## 2021-05-26 NOTE — Research (Unsigned)
Jesse Hall here for run in visit wit Core. He has questions and would like to talk to Dr Rennis Golden  before signing the consent. Dr Rennis Golden notified. States he will come to see him. After Speaking with Dr Rennis Golden pt has decide not to proceed

## 2021-05-26 NOTE — Research (Signed)
Pt here for core research he had questions he wanted to talk with Dr Debara Pickett. Dr Debara Pickett in to see pt. After speaking with Dr Debara Pickett pt decided not to proceed at this time with study.

## 2021-06-15 ENCOUNTER — Other Ambulatory Visit: Payer: Self-pay | Admitting: Internal Medicine

## 2021-07-18 ENCOUNTER — Encounter: Payer: Self-pay | Admitting: Neurology

## 2021-07-20 ENCOUNTER — Encounter: Payer: Self-pay | Admitting: Neurology

## 2021-07-20 ENCOUNTER — Ambulatory Visit: Payer: BC Managed Care – PPO | Admitting: Neurology

## 2021-07-20 VITALS — BP 135/76 | HR 88 | Ht 74.0 in | Wt 304.4 lb

## 2021-07-20 DIAGNOSIS — G4733 Obstructive sleep apnea (adult) (pediatric): Secondary | ICD-10-CM | POA: Diagnosis not present

## 2021-07-20 DIAGNOSIS — Z9989 Dependence on other enabling machines and devices: Secondary | ICD-10-CM | POA: Diagnosis not present

## 2021-07-20 NOTE — Progress Notes (Signed)
Upper limit of autopap pressure changed to 13 cm h2o on resmed. Order sent to [facility].  ?

## 2021-07-20 NOTE — Progress Notes (Deleted)
Subjective:  ?  ?Patient ID: Jesse Hall is a 61 y.o. male. ? ?HPI ?{Common ambulatory SmartLinks:[id number]} ? ?Review of Systems ? ?Objective:  ?Neurological Exam ? ?Physical Exam ? ?Assessment:  ? ?*** ? ?Plan:  ? ?*** ? ?

## 2021-07-20 NOTE — Progress Notes (Signed)
Subjective:    Patient ID: Jesse Hall is a 61 y.o. male.  HPI    Interim history:   Jesse Hall is a 61 year old right-handed gentleman with an underlying medical history of hyperlipidemia, gout, diabetes, back pain, hemochromatosis, DVT, PE, and obesity, who presents for follow-up consultation of his obstructive sleep apnea. The patient is unaccompanied today and presents after a gap of nearly 1.5 years. I first met him at the request on his primary care physician on 01/27/20, at which time he reported a prior diagnosis of OSA. He was on CPAP of 12 cm. He was advised to proceed with sleep testing.  He had a home sleep test on 02/17/2020 which indicated moderate obstructive sleep apnea with an AHI of 23.2/h, O2 nadir of 88%.  He was advised to start AutoPap therapy.  His set up date was 02/22/2021.  He has a [employer]Med air sense 11 auto machine.  He canceled interim appointments on 05/19/20 and 05/17/21; of note, he had interim medical complications including developing a large blood clot in the right leg and a PE, he now sees hematology, and vascular specialist, he also sees cardiology.  Today, 07/20/21: I reviewed his autoPAP compliance data from 06/19/2021 through 07/18/2021, presenting with 90 days, during which time he used his machine 24 days with percent use days greater than 4 hours at 70%, indicating adequate compliance.  Average AHI at goal at 1.8/h, leak acceptable with a 95th percentile at 13.9 L/min, average pressure over the 95th percentile at 11.8 with a maximum of 11.9 cm, range of 6 to 12 cm with EPR.  He reports doing well with his machine, when he travels, he does not use this machine but his old machine which explains the gaps in his treatment.  He is on a blood thinner and still has a clot in his right leg and has a tendency to have leg swelling now.  He is compliant with treatment and continues to benefit from it, is motivated to continue with treatment and is working on weight loss.  Compared  to his original sleep apnea diagnosis when he was diagnosed with severe sleep apnea at age 61 or so, he estimates that he lost at least 40 pounds.  The patient's allergies, current medications, family history, past medical history, past social history, past surgical history and problem list were reviewed and updated as appropriate.   Previously:   915/21: (He) was diagnosed with obstructive sleep apnea several years ago.  Prior sleep study results are not available for my review today.  He has a CPAP machine which is set at 12 cm, his machine is about 60 years old. I was able to review CPAP compliance data from his CPAP machine which he used until August 20.  His average usage was 6 hours and 51 minutes, average AHI 1/h, leak on the higher side with a 95th percentile at 17.2 L/min, pressure of 12 cm with EPR of 2.  Since August he started using another machine which is an AutoPap which he got from a friend.  Settings are not known and compliance data not available.  He reports that he felt better after starting the AutoPap machine. His Epworth sleepiness score is 12 out of 24, fatigue severity score is 54 out of 63.  He is a non-smoker and does not drink alcohol and does not drink caffeine daily, typically 1 glass of tea about twice a week.  He works at [employer], in [place], started working from home  since the pandemic and prefers it.  Has to go back to the office more now.  His nephew and nephew's wife live with him.  He is single, no children, no pets in the house.  His original sleep apnea diagnosis was about 18 years ago and his current machine is his second machine.  When he started using his friend's machine he felt a noticeable improvement in his daytime energy.  He uses a nasal mask and his DME company is [employer].  He denies any night to night nocturia or recurrent morning headaches.  He suspects that he has a family history of sleep apnea but no one has been officially tested.    His Past Medical  History Is Significant For: Past Medical History:  Diagnosis Date   Diabetes mellitus without complication (HCC)    High cholesterol    Hypertension    Kidney failure    Kidney stones     His Past Surgical History Is Significant For: Past Surgical History:  Procedure Laterality Date   kidney stone removal      His Family History Is Significant For: Family History  Problem Relation Age of Onset   CAD Mother    Sleep apnea Neg Hx     His Social History Is Significant For: Social History   Socioeconomic History   Marital status: Single    Spouse name: Not on file   Number of children: Not on file   Years of education: Not on file   Highest education level: Not on file  Occupational History   Not on file  Tobacco Use   Smoking status: Never   Smokeless tobacco: Never  Substance and Sexual Activity   Alcohol use: No   Drug use: Not on file   Sexual activity: Not on file  Other Topics Concern   Not on file  Social History Narrative   Not on file   Social Determinants of Health   Financial Resource Strain: Not on file  Food Insecurity: Not on file  Transportation Needs: Not on file  Physical Activity: Not on file  Stress: Not on file  Social Connections: Not on file    His Allergies Are:  No Known Allergies:   His Current Medications Are:  Outpatient Encounter Medications as of 07/20/2021  Medication Sig   allopurinol (ZYLOPRIM) 300 MG tablet Take 300 mg by mouth daily.   b complex vitamins tablet Take 1 tablet by mouth daily.   fenofibrate micronized (LOFIBRA) 134 MG capsule TAKE 1 CAPSULE BY MOUTH ONCE DAILY WITH A MEAL.   icosapent Ethyl (VASCEPA) 1 g capsule TAKE 2 CAPSULES BY MOUTH TWICE A DAY   losartan (COZAAR) 100 MG tablet Take 100 mg by mouth daily.   metFORMIN (GLUCOPHAGE) 500 MG tablet Take 1,000 mg by mouth 2 (two) times daily with a meal.   potassium citrate (UROCIT-K) 10 MEQ (1080 MG) SR tablet Take 10 mEq by mouth 2 (two) times daily.    sitaGLIPtin (JANUVIA) 100 MG tablet Take 100 mg by mouth daily.   VITAMIN D PO Take 1 tablet by mouth daily.   XARELTO 20 MG TABS tablet TAKE 1 TABLET BY MOUTH EVERY DAY   rosuvastatin (CRESTOR) 5 MG tablet Take 1 tablet (5 mg total) by mouth daily.   No facility-administered encounter medications on file as of 07/20/2021.  :  Review of Systems:  Out of a complete 14 point review of systems, all are reviewed and negative with the exception of these  symptoms as listed below:  Review of Systems  Neurological:        Pt is here for CPAP follow up . Pt states he has no questions or concerns for today's visit    Objective:  Neurological Exam  Physical Exam Physical Examination:   Vitals:   07/20/21 0943  BP: 135/76  Pulse: 88    General Examination: The patient is a very pleasant 61 y.o. male in no acute distress. He appears well-developed and well-nourished and well groomed.   HEENT: Normocephalic, atraumatic, pupils are equal, round and reactive to light.  Extraocular tracking is preserved, hearing grossly intact, speech is soft, stable.  Hearing is grossly intact, face is symmetric with mild decrease in facial animation.  No carotid bruits.  Airway examination reveals small mouth opening, moderate mouth dryness.  Small airway entry, Mallampati class III, redundant soft palate, uvula and tonsils not fully visualized.  Neck circumference of 19-1/2 inches.  Tongue protrudes centrally in palate elevates symmetrically.    Chest: Clear to auscultation without wheezing, rhonchi or crackles noted.   Heart: S1+S2+0, regular and normal without murmurs, rubs or gallops noted.    Abdomen: Soft, non-tender and non-distended.   Extremities: There is 1+ pitting edema in the distal lower extremities with prominent veins, right side more swollen than left.    Skin: Warm and dry without trophic changes noted.    Musculoskeletal: exam reveals no obvious joint deformities.    Neurologically:   Mental status: The patient is awake, alert and oriented in all 4 spheres. His immediate and remote memory, attention, language skills and fund of knowledge are appropriate. There is no evidence of aphasia, agnosia, apraxia or anomia. Speech is clear with normal prosody and enunciation. Thought process is linear. Mood is normal and affect appears blunted.  Cranial nerves II - XII are as described above under HEENT exam. Motor exam: Normal bulk, strength and tone is noted. There is no tremor. Fine motor skills and coordination: Grossly intact.  Cerebellar testing: No dysmetria or intention tremor.  Sensory exam: intact to light touch in the upper and lower extremities.  Gait, station and balance: He stands without difficulty, posture is age-appropriate.  He walks without difficulty.    Assessment and Plan:  In summary, Jesse Hall is a very pleasant 61 year old male with an underlying medical history of hyperlipidemia, gout, diabetes, back pain, hemochromatosis, DVT, PE, and obesity, who presents for follow-up consultation of his obstructive sleep apnea.  He has established treatment with a new AutoPap machine since October 2022.  He was originally diagnosed when he was about 61 years old.  His previous CPAP was set to 12 cm.  His average pressure is right around 12 cm, we mutually agreed to increase his AutoPap maximum pressure to 13 cm at this time.  Of note, he does not use the humidifier in the machine and has not used a humidifier in years.  He is using a medium nasal mask from [employer].  He is compliant with treatment and also uses his old machine as a travel machine now.  He is commended for his treatment.  He is advised to follow-up routinely to see me or nurse petitioner's in 1 year, we can offer him a MyChart video visit if he prefers.  He is up-to-date with supplies typically but reports that he does need to order a nasal mask.  He has enough filters.  Home sleep testing in October 2021 confirmed  his sleep apnea diagnosis, he  was in the moderate range at the time.  He is working on weight loss.  He is advised to continue using his AutoPap with full compliance and continue to work on weight loss.  His questions today and he was in agreement with our plan. I spent 30 minutes in total face-to-face time and in reviewing records during pre-charting, more than 50% of which was spent in counseling and coordination of care, reviewing test results, reviewing medications and treatment regimen and/or in discussing or reviewing the diagnosis of OSA, the prognosis and treatment options. Pertinent laboratory and imaging test results that were available during this visit with the patient were reviewed by me and considered in my medical decision making (see chart for details).

## 2021-07-20 NOTE — Patient Instructions (Addendum)
It was nice to see you again today.   ?Please continue using your autoPAP regularly. While your insurance requires that you use PAP at least 4 hours each night on 70% of the nights, I recommend, that you not skip any nights and use it throughout the night if you can. Getting used to PAP and staying with the treatment long term does take time and patience and discipline. Untreated obstructive sleep apnea when it is moderate to severe can have an adverse impact on cardiovascular health and raise her risk for heart disease, arrhythmias, hypertension, congestive heart failure, stroke and diabetes. Untreated obstructive sleep apnea causes sleep disruption, nonrestorative sleep, and sleep deprivation. This can have an impact on your day to day functioning and cause daytime sleepiness and impairment of cognitive function, memory loss, mood disturbance, and problems focussing. Using PAP regularly can improve these symptoms. ?I am glad you have been able to eventually get a new AutoPap machine.  As discussed, we will increase the maximum pressure on this machine to 13 cm from currently 12 cm. ?Keep up the good work! We can see you in 1 year, you can see one of our nurse practitioners as you are stable.  ? ? ?

## 2021-09-19 ENCOUNTER — Other Ambulatory Visit: Payer: Self-pay | Admitting: Internal Medicine

## 2021-12-21 ENCOUNTER — Other Ambulatory Visit: Payer: Self-pay | Admitting: Internal Medicine

## 2022-02-12 ENCOUNTER — Telehealth: Payer: Self-pay

## 2022-02-12 NOTE — Telephone Encounter (Signed)
**Note De-Identified Amin Fornwalt Obfuscation** I started a Vascepa PA through covermymeds. Key: [id number]

## 2022-02-12 NOTE — Telephone Encounter (Addendum)
**Note De-identified Ryott Rafferty Obfuscation** -----  **Note De-Identified Shatonia Hoots Obfuscation** Message from Fidel Levy, RN sent at 02/08/2022 12:05 PM EDT ----- Regarding: vascepa PA  I got a PA request for this patient in [facility] for Vascepa. Can you check on this for me? I'll email you the stuff.   Thanks!

## 2022-02-15 NOTE — Telephone Encounter (Signed)
**Note De-Identified Franco Duley Obfuscation** Vascepa PA form received from [facility]. I have completed and faxed back to [facility] Vernice Bowker Onbase.  Fax: Tx 'ok' Report CONE_EMAIL-to-Fax Adel Burch, Mardene Celeste   This message was sent Katee Wentland [facility], a product from [employer] http://www.biscom.com/  [employer] pioneered [employer] and continues to innovate the most advanced and intelligent fax and secure messaging solutions for enterprises. www.biscom.com                     -------Fax Transmission Report-------  To:               Recipient at [phone] Subject:          Fw: Vascepa PA Result:           The transmission was successful. Explanation:      All Pages Ok Pages Sent:       4 Connect Time:     1 minutes, 55 seconds Transmit Time:    02/15/2022 09:30 Transfer Rate:    14400 Status Code:      0000 Retry Count:      0 Job Id:           [id number] Unique Id:        [id number] Fax Line:         76 Fax Server:       [id number]

## 2022-02-22 ENCOUNTER — Ambulatory Visit (HOSPITAL_COMMUNITY): Admission: RE | Admit: 2022-02-22 | Payer: BC Managed Care – PPO | Source: Ambulatory Visit

## 2022-02-23 ENCOUNTER — Ambulatory Visit (HOSPITAL_COMMUNITY)
Admission: RE | Admit: 2022-02-23 | Discharge: 2022-02-23 | Disposition: A | Discharge status: Home / Self Care | Payer: BC Managed Care – PPO | Source: Ambulatory Visit | Attending: Hematology and Oncology | Admitting: Hematology and Oncology

## 2022-02-23 ENCOUNTER — Ambulatory Visit (HOSPITAL_COMMUNITY): Payer: BC Managed Care – PPO

## 2022-02-23 DIAGNOSIS — I2699 Other pulmonary embolism without acute cor pulmonale: Secondary | ICD-10-CM | POA: Diagnosis present

## 2022-02-23 DIAGNOSIS — I824Z1 Acute embolism and thrombosis of unspecified deep veins of right distal lower extremity: Secondary | ICD-10-CM | POA: Diagnosis not present

## 2022-02-23 NOTE — Progress Notes (Signed)
Right lower extremity venous duplex has been completed. Preliminary results can be found in [facility] Proc through chart review.  Results were given to Dr. Lindi Adie.  02/23/22 11:06 AM Jesse Hall RVT

## 2022-02-26 ENCOUNTER — Telehealth: Payer: Self-pay | Admitting: Hematology and Oncology

## 2022-02-26 NOTE — Telephone Encounter (Signed)
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 Our plan remains the same: Continue indefinite anticoagulation with Xarelto.

## 2022-02-27 NOTE — Telephone Encounter (Signed)
PA for Vascepa approved from 02/15/2022 to 02/15/2025.

## 2022-03-07 ENCOUNTER — Other Ambulatory Visit: Payer: Self-pay | Admitting: Internal Medicine

## 2022-03-29 ENCOUNTER — Other Ambulatory Visit: Payer: Self-pay | Admitting: Internal Medicine

## 2022-04-04 ENCOUNTER — Other Ambulatory Visit: Payer: Self-pay | Admitting: Internal Medicine

## 2022-05-01 ENCOUNTER — Other Ambulatory Visit: Payer: Self-pay | Admitting: Internal Medicine

## 2022-05-21 ENCOUNTER — Ambulatory Visit: Payer: BC Managed Care – PPO | Admitting: Internal Medicine

## 2022-05-23 ENCOUNTER — Other Ambulatory Visit: Payer: Self-pay | Admitting: Internal Medicine

## 2022-05-31 ENCOUNTER — Encounter: Payer: Self-pay | Admitting: Internal Medicine

## 2022-05-31 ENCOUNTER — Ambulatory Visit: Payer: BC Managed Care – PPO | Attending: Internal Medicine | Admitting: Internal Medicine

## 2022-05-31 VITALS — BP 136/70 | HR 80 | Ht 74.0 in | Wt 285.6 lb

## 2022-05-31 DIAGNOSIS — E783 Hyperchylomicronemia: Secondary | ICD-10-CM | POA: Diagnosis not present

## 2022-05-31 DIAGNOSIS — R931 Abnormal findings on diagnostic imaging of heart and coronary circulation: Secondary | ICD-10-CM | POA: Diagnosis not present

## 2022-05-31 NOTE — Patient Instructions (Signed)
Medication Instructions:  Your physician recommends that you continue on your current medications as directed. Please refer to the Current Medication list given to you today.  *If you need a refill on your cardiac medications before your next appointment, please call your pharmacy*   Lab Work:  Please have lab results faxed to Dr. Lysbeth Penner office at [phone]   Follow-Up: At [facility], you and your health needs are our priority.  As part of our continuing mission to provide you with exceptional heart care, we have created designated Provider Care Teams.  These Care Teams include your primary Cardiologist (physician) and Advanced Practice Providers (APPs -  Physician Assistants and Nurse Practitioners) who all work together to provide you with the care you need, when you need it.  We recommend signing up for the patient portal called "MyChart".  Sign up information is provided on this After Visit Summary.  MyChart is used to connect with patients for Virtual Visits (Telemedicine).  Patients are able to view lab/test results, encounter notes, upcoming appointments, etc.  Non-urgent messages can be sent to your provider as well.   To learn more about what you can do with MyChart, go to [email or web address].    Your next appointment:    6 months with Dr. Debara Pickett

## 2022-05-31 NOTE — Progress Notes (Signed)
LIPID CLINIC CONSULT NOTE  Chief Complaint:  Follow-up elevated triglycerides  Primary Care Physician: Velna Hatchet, MD  Primary Cardiologist:  None  HPI:  Jesse Hall is a 62 y.o. male who is being seen today for the evaluation of high triglycerides at the request of Velna Hatchet, MD.  This is a pleasant 62 year old male who is referred by Dr. Ardeth Perfect for high triglycerides.  He has past medical history significant for diabetes however this is only in the past several years.  He has otherwise a longstanding history of high triglycerides for more than 20 years.  This complicated by obesity, hypertension, some degree of kidney dysfunction with kidney stones, OSA on CPAP, and a family history of heart disease on his mother side.  Most recently his lipid profile showed a total cholesterol 170 with triglycerides of 1001 and HDL 25.  LDL was not calculated.  Previously was taking fenofibrate 145 mg daily however has been off of it for few weeks as he says it causes him some cramping and unusual side effects.  He had also been briefly trialed on Vascepa in the past.  According to him he took it for a week and did not have significant improvement in his numbers and therefore it was stopped, however it may have just been samples.  Is not clear to me whether he had a significant amount of time on the medication to determine a benefit.  Symptomatically he reports being very fatigued.  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  When he gets some rest and sleep he seems to do better.  He denies any overt chest pain.  03/25/2019  Jesse Hall returns today for follow-up of his elevated triglycerides.  I am pleased to report a significant improvement in his numbers.  His total cholesterol today is now 164, triglycerides are 505 (reduced from 1001), HDL 27 and LDL is 60.  He did undergo coronary artery calcium  scoring which was 100, this is 75th percentile based on age and sex matched controls.  Aggressive therapy is recommended given his history of diabetes as well.  His target LDL is less than 70 which she is achieved and he has had significant reduction in his triglycerides more than 50% after adjusting his medications.  He seems to be tolerating them well additionally.  He started to exercise a little more regularly walking at least a couple miles at a fast pace several days a week.  He does report he continues to be very stressed at his job.  12/02/2019  Jesse Hall is seen today in follow-up.  He had an initial excellent response to Vascepa and fenofibrate with improvement in triglycerides down to 505 however recent repeat lab work shows an increase in triglycerides now up to almost 1000 (998).  He says he is actually change his diet more significantly, reducing carbohydrates and lost 7 pounds.  Daily fasting blood sugars are better as well.  12/01/2020  Jesse Hall returns today for follow-up.  Unfortunately he has been struggling a lot at work.  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  He seemed very tired today in the office.  He says he had difficulty maintaining any kind of regular schedule including eating and working.  Unfortunately his triglycerides have gone up accordingly.  He is compliant with medications.  We had discussed starting rosuvastatin 5 mg daily previously but he never was able to start the medication.  This would be a requirement for him to enroll in the core trial.  Recent cholesterol from 2 days ago showed total 184, triglycerides 875, HDL 23 and LDL was not calculated due to high triglycerides.  His previous triglycerides were 430 but had been as high as 998 about a year ago.  05/31/2022  Jesse Hall is seen today in follow-up.  He reports that he is trying to get off of some of his cholesterol medicines.  He is lost about 30 pounds.  We  did do genetic testing as mentioned above.  He had no pathogenic variants noted.  He reports some improvement in his triglycerides with labs in October through his PCP however we are not privy to those labs at this time.  He has repeat labs coming up in 2 weeks.  He wants to wait until those labs are drawn to see what his numbers are.  PMHx:  Past Medical History:  Diagnosis Date   Diabetes mellitus without complication (HCC)    High cholesterol    Hypertension    Kidney failure    Kidney stones     Past Surgical History:  Procedure Laterality Date   kidney stone removal      FAMHx:  Family History  Problem Relation Age of Onset   CAD Mother    Sleep apnea Neg Hx     SOCHx:   reports that he has never smoked. He has never used smokeless tobacco. He reports that he does not drink alcohol. No history on file for drug use.  ALLERGIES:  No Known Allergies  ROS: Pertinent items noted in HPI and remainder of comprehensive ROS otherwise negative.  HOME MEDS: Current Outpatient Medications on File Prior to Visit  Medication Sig Dispense Refill   allopurinol (ZYLOPRIM) 300 MG tablet Take 300 mg by mouth daily.     fenofibrate micronized (LOFIBRA) 134 MG capsule TAKE 1 CAPSULE BY MOUTH ONCE DAILY WITH A MEAL. 90 capsule 3   losartan (COZAAR) 100 MG tablet Take 100 mg by mouth daily.     potassium citrate (UROCIT-K) 10 MEQ (1080 MG) SR tablet Take 10 mEq by mouth 2 (two) times daily.     rosuvastatin (CRESTOR) 5 MG tablet TAKE 1 TABLET (5 MG TOTAL) BY MOUTH DAILY. 90 tablet 1   sitaGLIPtin (JANUVIA) 100 MG tablet Take 100 mg by mouth daily.     VASCEPA 1 g capsule TAKE 2 CAPSULES BY MOUTH TWICE A DAY 360 capsule 0   VITAMIN D PO Take 1 tablet by mouth daily.     XARELTO 20 MG TABS tablet TAKE 1 TABLET BY MOUTH EVERY DAY 90 tablet 4   No current facility-administered medications on file prior to visit.    LABS/IMAGING: No results found for this or any previous visit (from the  past 48 hour(s)). No results found.  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   Jesse Hall has had significant weight loss again and I suspect this might correlate with the improvement in his triglycerides.  He already said that they were better in October and he has repeat labs coming up later this month.  I have asked them to send it from his PCP office since we do not have direct access to his labs.  We discussed his medications and he would like to try to stop some of them if possible.  I will review the labs once we receive them and communicate with him my recommendations.  Follow-up with repeat lipids in 6 months.  Jesse Casino, MD, [facility], [place] Director of the Advanced Lipid Disorders &  Cardiovascular Risk Reduction Clinic Diplomate of the American Board of Clinical Lipidology Attending Cardiologist  Direct Dial: [phone]  Fax: [phone]  Website:  [email or web address] Nalini Alcaraz 05/31/2022, 8:25 AM

## 2022-07-24 ENCOUNTER — Telehealth: Payer: BC Managed Care – PPO | Admitting: Family Medicine

## 2022-08-14 NOTE — Progress Notes (Deleted)
PATIENT: Jesse Hall DOB: [birth year]  REASON FOR VISIT: follow up HISTORY FROM: patient  Virtual Visit via Telephone Note  I connected with Jesse Hall on 08/14/22 at  9:45 AM EDT by telephone and verified that I am speaking with the correct person using two identifiers.   I discussed the limitations, risks, security and privacy concerns of performing an evaluation and management service by telephone and the availability of in person appointments. I also discussed with the patient that there may be a patient responsible charge related to this service. The patient expressed understanding and agreed to proceed.   History of Present Illness:  08/14/22 ALL: Jesse Hall is a 62 y.o. male here today for follow up for OSa on CPAP.    History (copied from Dr Guadelupe Sabin previous note)  Jesse Hall is a 62 year old right-handed gentleman with an underlying medical history of hyperlipidemia, gout, diabetes, back pain, hemochromatosis, DVT, PE, and obesity, who presents for follow-up consultation of his obstructive sleep apnea. The patient is unaccompanied today and presents after a gap of nearly 1.5 years. I first met him at the request on his primary care physician on 01/27/20, at which time he reported a prior diagnosis of OSA. He was on CPAP of 12 cm. He was advised to proceed with sleep testing.  He had a home sleep test on 02/17/2020 which indicated moderate obstructive sleep apnea with an AHI of 23.2/h, O2 nadir of 88%.  He was advised to start AutoPap therapy.  His set up date was 02/22/2021.  He has a ResMed air sense 11 auto machine.   He canceled interim appointments on 05/19/20 and 05/17/21; of note, he had interim medical complications including developing a large blood clot in the right leg and a PE, he now sees hematology, and vascular specialist, he also sees cardiology.   Today, 07/20/21: I reviewed his autoPAP compliance data from 06/19/2021 through 07/18/2021, presenting with 90 days,  during which time he used his machine 24 days with percent use days greater than 4 hours at 70%, indicating adequate compliance.  Average AHI at goal at 1.8/h, leak acceptable with a 95th percentile at 13.9 L/min, average pressure over the 95th percentile at 11.8 with a maximum of 11.9 cm, range of 6 to 12 cm with EPR.  He reports doing well with his machine, when he travels, he does not use this machine but his old machine which explains the gaps in his treatment.  He is on a blood thinner and still has a clot in his right leg and has a tendency to have leg swelling now.  He is compliant with treatment and continues to benefit from it, is motivated to continue with treatment and is working on weight loss.  Compared to his original sleep apnea diagnosis when he was diagnosed with severe sleep apnea at age 39 or so, he estimates that he lost at least 40 pounds.   Observations/Objective:  Generalized: Well developed, in no acute distress  Mentation: Alert oriented to time, place, history taking. Follows all commands speech and language fluent   Assessment and Plan:  62 y.o. year old male  has a past medical history of Diabetes mellitus without complication ([place]), High cholesterol, Hypertension, Kidney failure, and Kidney stones. here with  No diagnosis found.  No orders of the defined types were placed in this encounter.   No orders of the defined types were placed in this encounter.    Follow Up Instructions:  I discussed the assessment  and treatment plan with the patient. The patient was provided an opportunity to ask questions and all were answered. The patient agreed with the plan and demonstrated an understanding of the instructions.   The patient was advised to call back or seek an in-person evaluation if the symptoms worsen or if the condition fails to improve as anticipated.  I provided *** minutes of non-face-to-face time during this encounter. Patient located at their place of  residence during Center visit. Provider is in the office.    Debbora Presto, NP

## 2022-08-21 ENCOUNTER — Telehealth: Payer: BC Managed Care – PPO | Admitting: Family Medicine

## 2022-08-21 DIAGNOSIS — G4733 Obstructive sleep apnea (adult) (pediatric): Secondary | ICD-10-CM

## 2022-08-31 IMAGING — DX DG CHEST 2V
2 series · 2 of 2 positions shown · non-contrast
Comparison: None.

CLINICAL DATA: DVT with worsening mid chest pain.

EXAM:
CHEST - 2 VIEW

[chest pa]
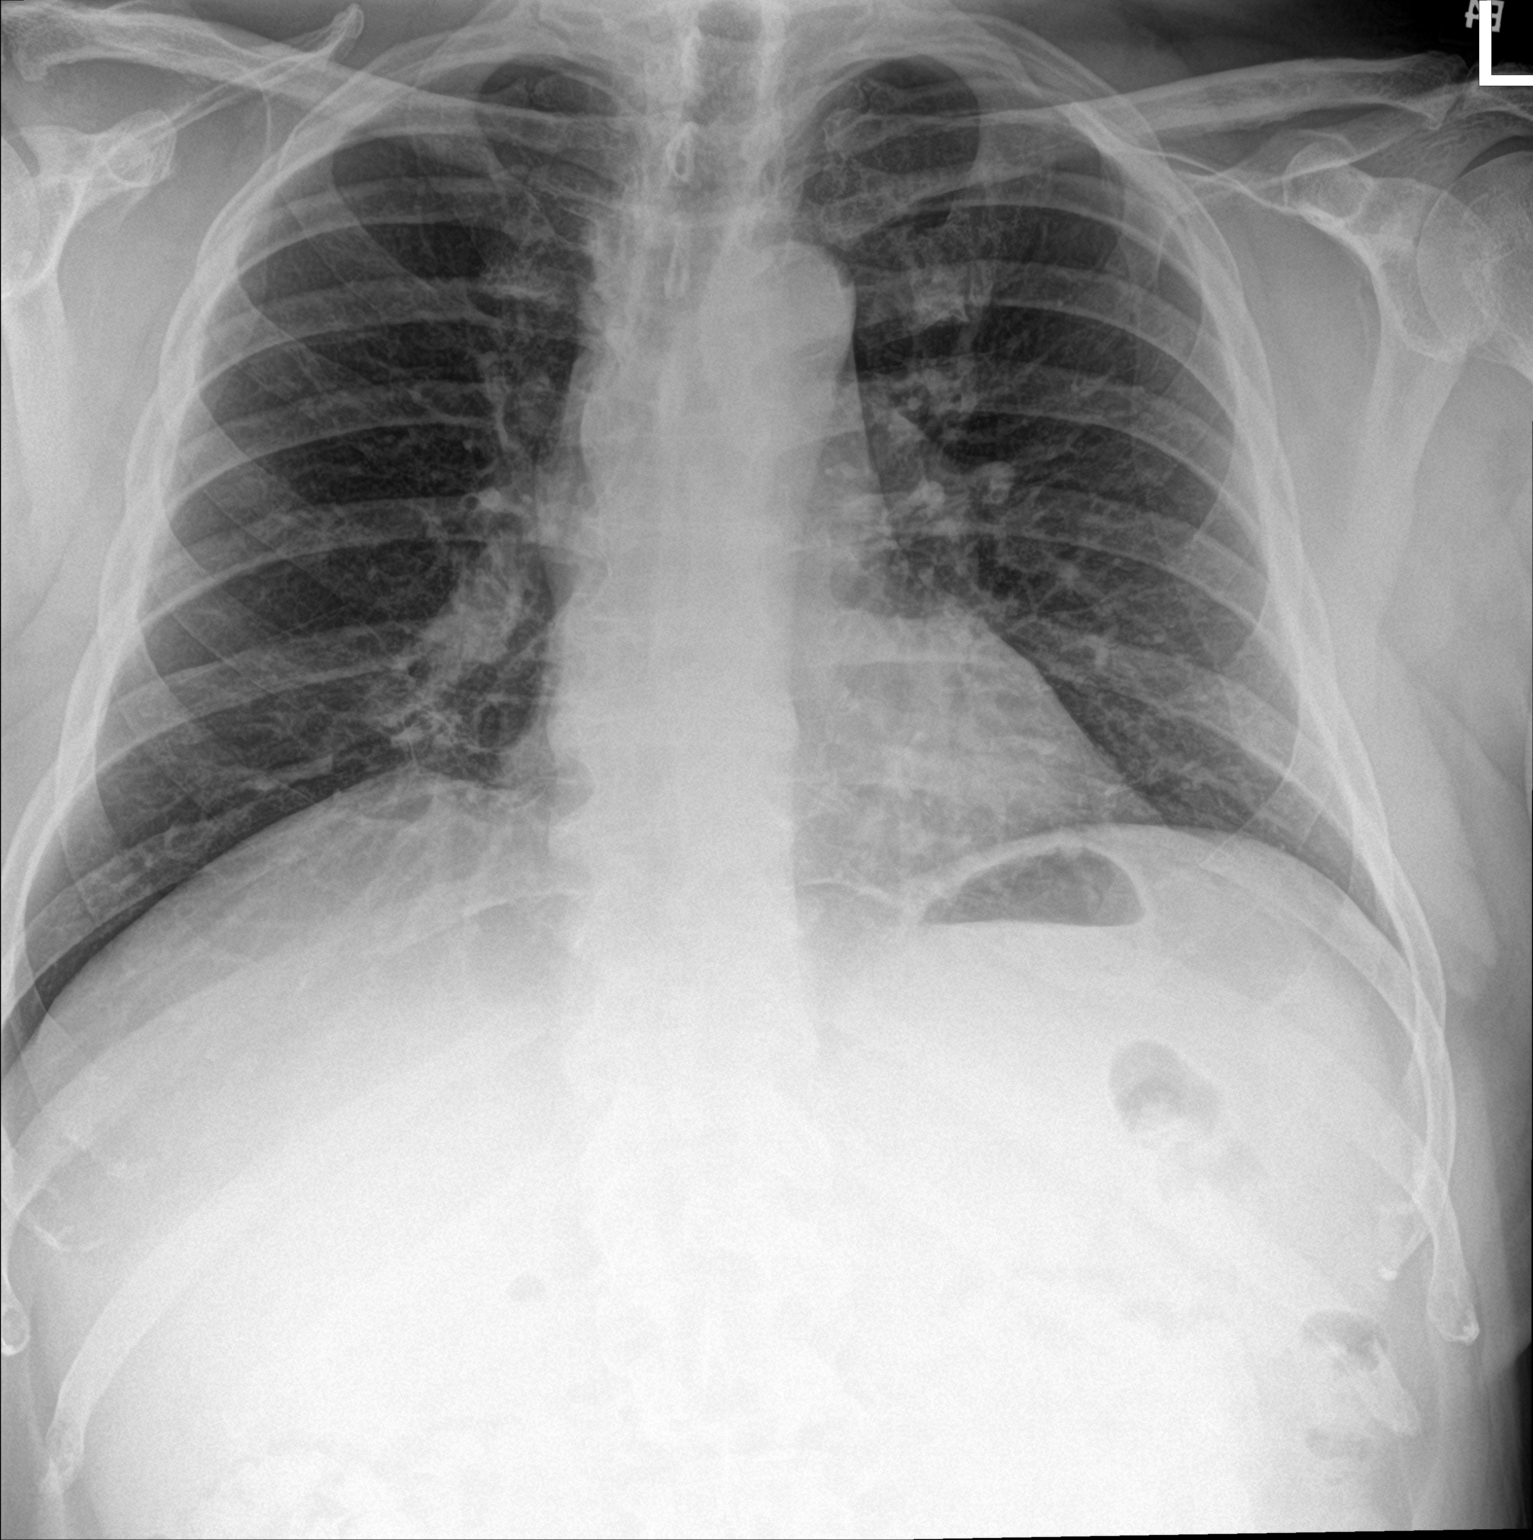

[chest lat]
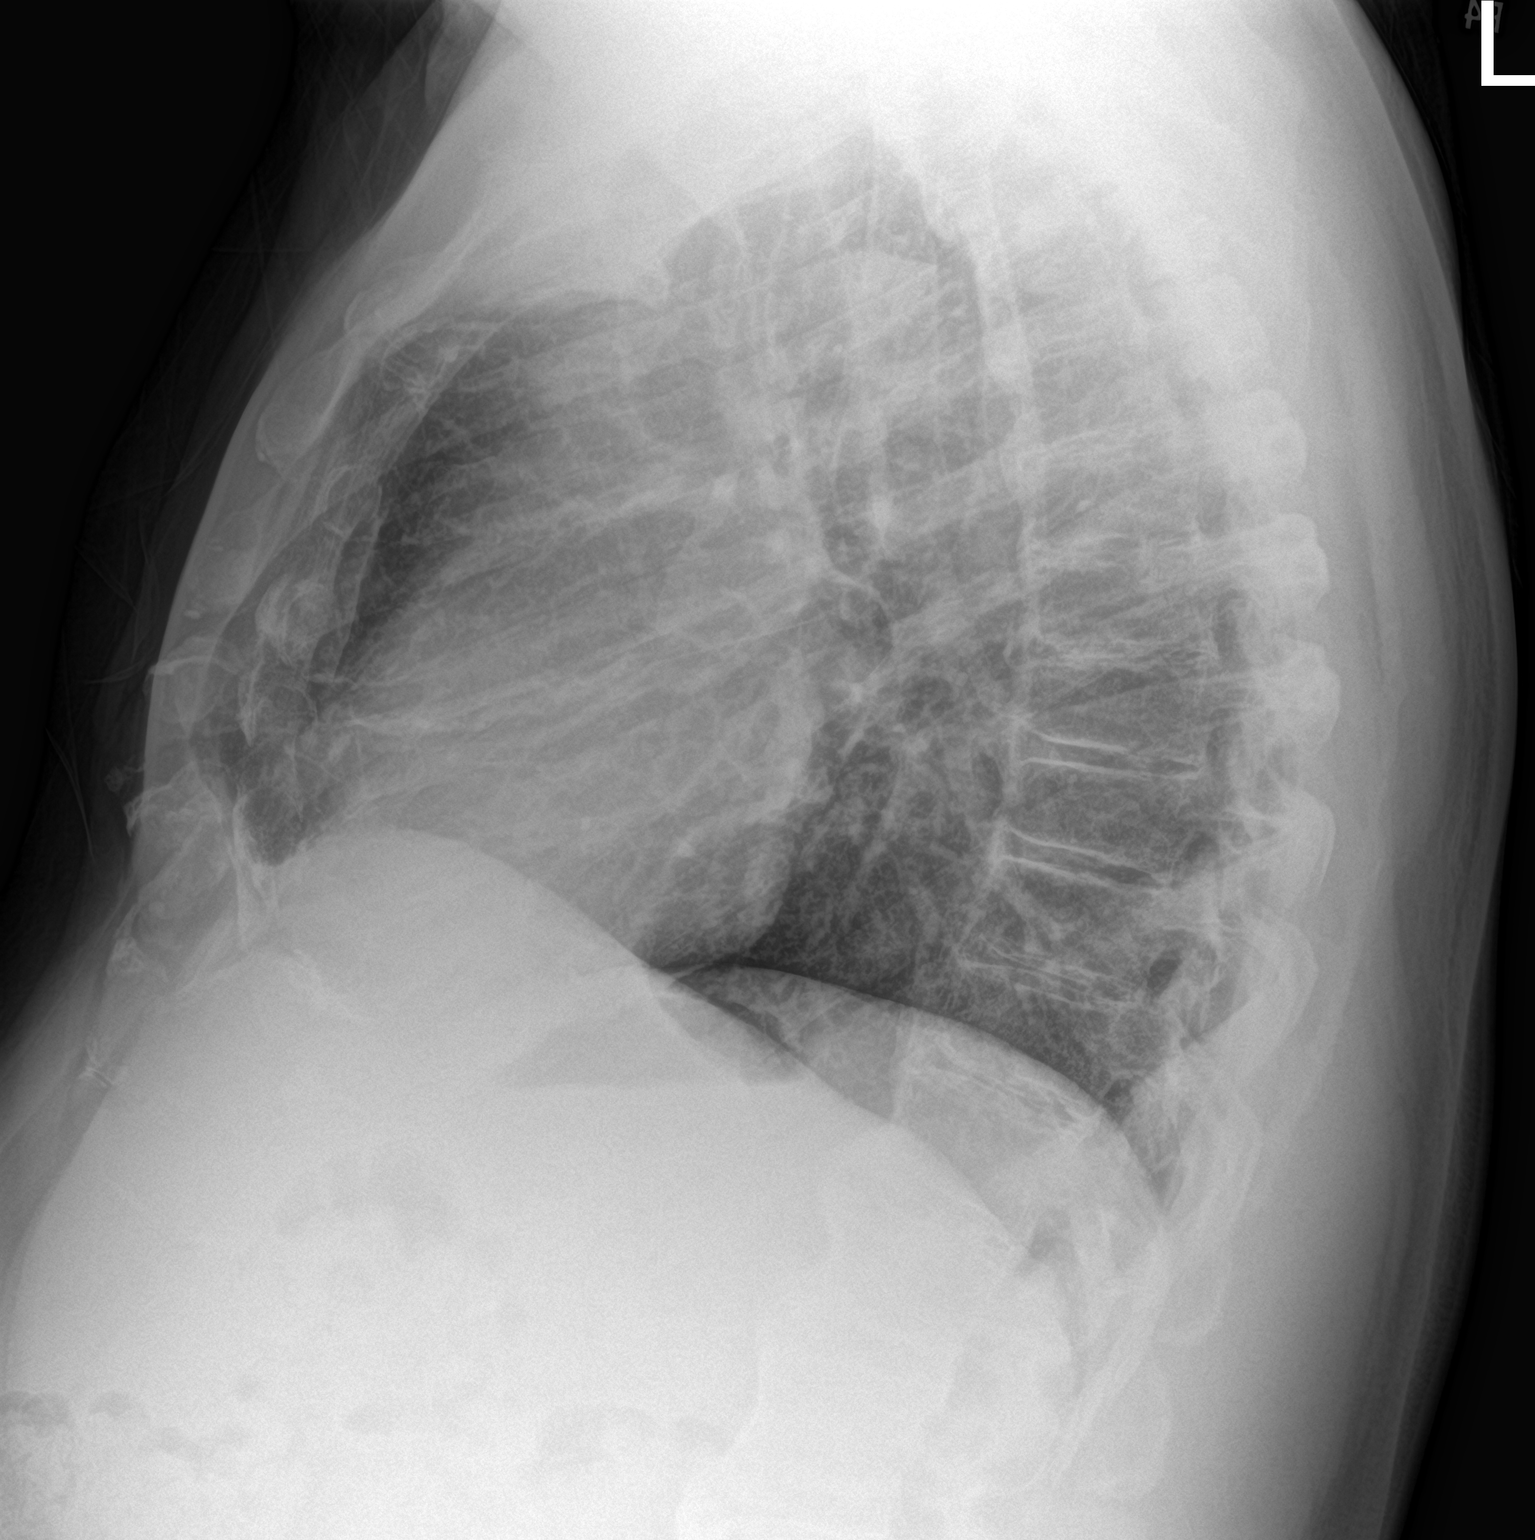

[2 of 2 positions shown; findings below may reference images not displayed]

FINDINGS: Cardiomediastinal silhouette is normal. The lungs are clear. The
vascularity is normal. No effusions. Ordinary degenerative changes
affect the spine.
IMPRESSION: No active cardiopulmonary disease.

## 2022-09-03 ENCOUNTER — Other Ambulatory Visit: Payer: Self-pay | Admitting: Hematology and Oncology

## 2022-09-06 ENCOUNTER — Telehealth: Payer: Self-pay | Admitting: Hematology and Oncology

## 2022-09-06 NOTE — Telephone Encounter (Signed)
Spoke with patient confirming upcoming appointment  

## 2022-10-04 ENCOUNTER — Encounter: Payer: Self-pay | Admitting: *Deleted

## 2022-10-04 NOTE — Progress Notes (Signed)
PATIENT: Jesse Hall DOB: 07/11/1960  REASON FOR VISIT: follow up HISTORY FROM: patient  Virtual Visit via Telephone Note  I connected with Jesse Hall on 10/09/22 at  8:15 AM EDT by telephone and verified that I am speaking with the correct person using two identifiers.   I discussed the limitations, risks, security and privacy concerns of performing an evaluation and management service by telephone and the availability of in person appointments. I also discussed with the patient that there may be a patient responsible charge related to this service. The patient expressed understanding and agreed to proceed.   History of Present Illness:  10/09/22 ALL: Jesse Hall is a 62 y.o. male here today for follow up for OSA on CPAP. He was last seen by Dr Frances Furbish 07/2021 and doing well. He continues to do well on therapy. He is using CPAP nightly for about 5-6 hours. He admits that he has not used CPAP as often, recently, due to illness. He has had to sleep in his recliner and does not use CPAP then. He is followed regularly by PCP. He denies any concerns with his machine or supplies.     History (copied from Dr Teofilo Pod previous note)  Jesse Hall is a 62 year old right-handed gentleman with an underlying medical history of hyperlipidemia, gout, diabetes, back pain, hemochromatosis, DVT, PE, and obesity, who presents for follow-up consultation of his obstructive sleep apnea. The patient is unaccompanied today and presents after a gap of nearly 1.5 years. I first met him at the request on his primary care physician on 01/27/20, at which time he reported a prior diagnosis of OSA. He was on CPAP of 12 cm. He was advised to proceed with sleep testing.  He had a home sleep test on 02/17/2020 which indicated moderate obstructive sleep apnea with an AHI of 23.2/h, O2 nadir of 88%.  He was advised to start AutoPap therapy.  His set up date was 02/22/2021.  He has a ResMed air sense 11 auto machine.   He  canceled interim appointments on 05/19/20 and 05/17/21; of note, he had interim medical complications including developing a large blood clot in the right leg and a PE, he now sees hematology, and vascular specialist, he also sees cardiology.   Today, 07/20/21: I reviewed his autoPAP compliance data from 06/19/2021 through 07/18/2021, presenting with 90 days, during which time he used his machine 24 days with percent use days greater than 4 hours at 70%, indicating adequate compliance.  Average AHI at goal at 1.8/h, leak acceptable with a 95th percentile at 13.9 L/min, average pressure over the 95th percentile at 11.8 with a maximum of 11.9 cm, range of 6 to 12 cm with EPR.  He reports doing well with his machine, when he travels, he does not use this machine but his old machine which explains the gaps in his treatment.  He is on a blood thinner and still has a clot in his right leg and has a tendency to have leg swelling now.  He is compliant with treatment and continues to benefit from it, is motivated to continue with treatment and is working on weight loss.  Compared to his original sleep apnea diagnosis when he was diagnosed with severe sleep apnea at age 49 or so, he estimates that he lost at least 40 pounds.   Observations/Objective:  Generalized: Well developed, in no acute distress  Mentation: Alert oriented to time, place, history taking. Follows all commands speech and language fluent  Assessment and Plan:  62 y.o. year old male  has a past medical history of Diabetes mellitus without complication (HCC), High cholesterol, Hypertension, Kidney failure, and Kidney stones. here with    ICD-10-CM   1. OSA on CPAP  G47.33 For home use only DME continuous positive airway pressure (CPAP)      Jesse Hall reports doing well with CPAP. He admits to lower compliance, recently, due to illness but denies concerns with machine or supplies. Compliance report reveals 67% daily and 58% four our usage over the past 90  days. He was encouraged to focus on using CPAP nightly for at least 4 hours. I will recheck compliance data in about 2-3 months. He will continue to follow up regularly with his care team. He will return to see me in 1 year, sooner if needed.   Orders Placed This Encounter  Procedures   For home use only DME continuous positive airway pressure (CPAP)    Supplies    Order Specific Question:   Length of Need    Answer:   Lifetime    Order Specific Question:   Patient has OSA or probable OSA    Answer:   Yes    Order Specific Question:   Is the patient currently using CPAP in the home    Answer:   Yes    Order Specific Question:   Settings    Answer:   Other see comments    Order Specific Question:   CPAP supplies needed    Answer:   Mask, headgear, cushions, filters, heated tubing and water chamber    No orders of the defined types were placed in this encounter.   Follow Up Instructions:  I discussed the assessment and treatment plan with the patient. The patient was provided an opportunity to ask questions and all were answered. The patient agreed with the plan and demonstrated an understanding of the instructions.   The patient was advised to call back or seek an in-person evaluation if the symptoms worsen or if the condition fails to improve as anticipated.  I provided 15 minutes of non-face-to-face time during this encounter. Patient located at their place of residence during Mychart visit. Provider is in the office.    Shawnie Dapper, NP

## 2022-10-04 NOTE — Patient Instructions (Signed)

## 2022-10-09 ENCOUNTER — Encounter: Payer: Self-pay | Admitting: Family Medicine

## 2022-10-09 ENCOUNTER — Telehealth (INDEPENDENT_AMBULATORY_CARE_PROVIDER_SITE_OTHER): Payer: BC Managed Care – PPO | Admitting: Family Medicine

## 2022-10-09 DIAGNOSIS — G4733 Obstructive sleep apnea (adult) (pediatric): Secondary | ICD-10-CM

## 2022-10-09 NOTE — Progress Notes (Signed)
.  Epworth Sleepiness Scale 0= would never doze 1= slight chance of dozing 2= moderate chance of dozing 3= high chance of dozing     Sitting and reading: 3 Watching TV: 2 Sitting inactive in a public place (ex. Theater or meeting): 1 As a passenger in a car for an hour without a break: 3 Lying down to rest in the afternoon: 3 Sitting and talking to someone: 0 Sitting quietly after lunch (no alcohol): 2 In a car, while stopped in traffic: 0 Total:

## 2022-10-19 ENCOUNTER — Other Ambulatory Visit: Payer: Self-pay | Admitting: Internal Medicine

## 2022-10-22 MED ORDER — ICOSAPENT ETHYL 1 G PO CAPS: 2.0000 g | ORAL_CAPSULE | Freq: Two times a day (BID) | ORAL | 3 refills | Status: DC

## 2022-11-27 ENCOUNTER — Other Ambulatory Visit: Payer: Self-pay | Admitting: Internal Medicine

## 2022-12-05 NOTE — Progress Notes (Signed)
Patient Care Team: Alysia Penna, MD as PCP - General (Internal Medicine)  DIAGNOSIS: No diagnosis found.  SUMMARY OF ONCOLOGIC HISTORY: Oncology History   No history exists.    CHIEF COMPLIANT:   INTERVAL HISTORY: Jesse Hall is a   ALLERGIES:  has No Known Allergies.  MEDICATIONS:  Current Outpatient Medications  Medication Sig Dispense Refill   allopurinol (ZYLOPRIM) 300 MG tablet Take 300 mg by mouth daily.     fenofibrate micronized (LOFIBRA) 134 MG capsule TAKE 1 CAPSULE BY MOUTH ONCE DAILY WITH A MEAL. 90 capsule 3   icosapent Ethyl (VASCEPA) 1 g capsule Take 2 capsules (2 g total) by mouth 2 (two) times daily. 360 capsule 3   losartan (COZAAR) 100 MG tablet Take 100 mg by mouth daily.     potassium citrate (UROCIT-K) 10 MEQ (1080 MG) SR tablet Take 10 mEq by mouth 2 (two) times daily.     rivaroxaban (XARELTO) 20 MG TABS tablet TAKE 1 TABLET BY MOUTH EVERY DAY 90 tablet 0   rosuvastatin (CRESTOR) 5 MG tablet TAKE 1 TABLET (5 MG TOTAL) BY MOUTH DAILY. 90 tablet 3   sitaGLIPtin (JANUVIA) 100 MG tablet Take 100 mg by mouth daily.     VITAMIN D PO Take 1 tablet by mouth daily.     No current facility-administered medications for this visit.    PHYSICAL EXAMINATION: ECOG PERFORMANCE STATUS: {CHL ONC ECOG PS:[id number]}  There were no vitals filed for this visit. There were no vitals filed for this visit.  BREAST:*** No palpable masses or nodules in either right or left breasts. No palpable axillary supraclavicular or infraclavicular adenopathy no breast tenderness or nipple discharge. (exam performed in the presence of a chaperone)  LABORATORY DATA:  I have reviewed the data as listed    Latest Ref Rng & Units 02/17/2021    9:53 AM 02/10/2020   11:45 AM 09/28/2014    2:00 PM  CMP  Glucose 70 - 99 mg/dL 161  096  045   BUN 6 - 20 mg/dL 26  28  35   Creatinine 0.61 - 1.24 mg/dL 4.09  8.11  9.14   Sodium 135 - 145 mmol/L 138  139  135   Potassium 3.5 - 5.1  mmol/L 4.7  4.4  4.6   Chloride 98 - 111 mmol/L 109  106  101   CO2 22 - 32 mmol/L 19  25  20    Calcium 8.9 - 10.3 mg/dL 9.4  9.5  9.2   Total Protein 6.5 - 8.1 g/dL 7.0  7.6  7.4   Total Bilirubin 0.3 - 1.2 mg/dL 0.7  1.1  1.4   Alkaline Phos 38 - 126 U/L 40  44  48   AST 15 - 41 U/L 27  28  34   ALT 0 - 44 U/L 31  33  48     Lab Results  Component Value Date   WBC 6.3 02/17/2021   HGB 12.8 (L) 02/17/2021   HCT 35.4 (L) 02/17/2021   MCV 82.3 02/17/2021   PLT 173 02/17/2021   NEUTROABS 3.5 02/17/2021    ASSESSMENT & PLAN:  No problem-specific Assessment & Plan notes found for this encounter.    No orders of the defined types were placed in this encounter.  The patient has a good understanding of the overall plan. he agrees with it. he will call with any problems that may develop before the next visit here. Total time spent: 30 mins including  face to face time and time spent for planning, charting and co-ordination of care   Sherlyn Lick, CMA 12/05/22    I Janan Ridge am acting as a Neurosurgeon for [employer]  ***

## 2022-12-06 ENCOUNTER — Inpatient Hospital Stay: Payer: BC Managed Care – PPO | Attending: Hematology and Oncology | Admitting: Hematology and Oncology

## 2022-12-06 ENCOUNTER — Other Ambulatory Visit: Payer: Self-pay

## 2022-12-06 VITALS — BP 127/87 | HR 82 | Temp 97.3°F | Resp 18 | Ht 74.0 in | Wt 283.5 lb

## 2022-12-06 DIAGNOSIS — Z86711 Personal history of pulmonary embolism: Secondary | ICD-10-CM | POA: Diagnosis not present

## 2022-12-06 DIAGNOSIS — I824Z1 Acute embolism and thrombosis of unspecified deep veins of right distal lower extremity: Secondary | ICD-10-CM | POA: Diagnosis not present

## 2022-12-06 DIAGNOSIS — Z86718 Personal history of other venous thrombosis and embolism: Secondary | ICD-10-CM | POA: Diagnosis not present

## 2022-12-06 DIAGNOSIS — D6862 Lupus anticoagulant syndrome: Secondary | ICD-10-CM | POA: Insufficient documentation

## 2022-12-06 DIAGNOSIS — Z7901 Long term (current) use of anticoagulants: Secondary | ICD-10-CM | POA: Insufficient documentation

## 2022-12-06 MED ORDER — RIVAROXABAN 20 MG PO TABS: 20.0000 mg | ORAL_TABLET | Freq: Every day | ORAL | 3 refills | Status: DC

## 2022-12-06 NOTE — Assessment & Plan Note (Addendum)
Acute pulmonary embolism (HCC) VQ scan 02/16/2020: Acute multifocal right lung pulmonary embolism with suspicion of right upper lobe right middle lobe and right lower lobe pulmonary artery occlusion Current treatment: Xarelto   Lower leg DVT (deep venous thromboembolism), acute, right [facility]) Patient was complaining of left leg pain and ultrasound of the leg revealed extensive DVT involving the femoral-popliteal gastrocnemius and soleus veins   Lupus anticoagulant test: Positive Repeat testing: Negative  Covid inf: May had lots of side effects incl brain fog.   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  I renewed his breast option for a year. He is extremely busy with work and is hoping to retire in the next few years so he can enjoy life.  He is very stressed and has difficulty with sleep.

## 2022-12-12 ENCOUNTER — Telehealth: Payer: Self-pay | Admitting: Family Medicine

## 2022-12-12 ENCOUNTER — Encounter: Payer: Self-pay | Admitting: Family Medicine

## 2022-12-12 NOTE — Telephone Encounter (Signed)
Can you guys please attach a copy of most recent 90 day CPAP report, please? TY!

## 2022-12-24 ENCOUNTER — Encounter: Payer: Self-pay | Admitting: Internal Medicine

## 2023-01-23 ENCOUNTER — Encounter: Payer: Self-pay | Admitting: Internal Medicine

## 2023-01-23 ENCOUNTER — Ambulatory Visit: Payer: BC Managed Care – PPO | Attending: Internal Medicine | Admitting: Internal Medicine

## 2023-01-23 VITALS — BP 131/71 | HR 82 | Ht 74.0 in | Wt 290.0 lb

## 2023-01-23 DIAGNOSIS — R931 Abnormal findings on diagnostic imaging of heart and coronary circulation: Secondary | ICD-10-CM | POA: Diagnosis not present

## 2023-01-23 DIAGNOSIS — E783 Hyperchylomicronemia: Secondary | ICD-10-CM

## 2023-01-23 MED ORDER — FENOFIBRATE 54 MG PO TABS: 54.0000 mg | ORAL_TABLET | Freq: Every day | ORAL | 3 refills | Status: DC

## 2023-01-23 NOTE — Patient Instructions (Signed)
Medication Instructions:  DECREASE fenofibrate to 54mg  daily CONTINUE all other current meds  *If you need a refill on your cardiac medications before your next appointment, please call your pharmacy*   Follow-Up: At [facility], you and your health needs are our priority.  As part of our continuing mission to provide you with exceptional heart care, we have created designated Provider Care Teams.  These Care Teams include your primary Cardiologist (physician) and Advanced Practice Providers (APPs -  Physician Assistants and Nurse Practitioners) who all work together to provide you with the care you need, when you need it.  We recommend signing up for the patient portal called "MyChart".  Sign up information is provided on this After Visit Summary.  MyChart is used to connect with patients for Virtual Visits (Telemedicine).  Patients are able to view lab/test results, encounter notes, upcoming appointments, etc.  Non-urgent messages can be sent to your provider as well.   To learn more about what you can do with MyChart, go to [email or web address].    Your next appointment:   12 months with Dr. Rennis Golden

## 2023-01-23 NOTE — Progress Notes (Signed)
LIPID CLINIC CONSULT NOTE  Chief Complaint:  Follow-up elevated triglycerides  Primary Care Physician: Alysia Penna, MD  Primary Cardiologist:  None  HPI:  Jesse Hall is a 62 y.o. male who is being seen today for the evaluation of high triglycerides at the request of Alysia Penna, MD.  This is a pleasant 62 year old male who is referred by Dr. Link Snuffer for high triglycerides.  He has past medical history significant for diabetes however this is only in the past several years.  He has otherwise a longstanding history of high triglycerides for more than 20 years.  This complicated by obesity, hypertension, some degree of kidney dysfunction with kidney stones, OSA on CPAP, and a family history of heart disease on his mother side.  Most recently his lipid profile showed a total cholesterol 170 with triglycerides of 1001 and HDL 25.  LDL was not calculated.  Previously was taking fenofibrate 145 mg daily however has been off of it for few weeks as he says it causes him some cramping and unusual side effects.  He had also been briefly trialed on Vascepa in the past.  According to him he took it for a week and did not have significant improvement in his numbers and therefore it was stopped, however it may have just been samples.  Is not clear to me whether he had a significant amount of time on the medication to determine a benefit.  Symptomatically he reports being very fatigued.  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  When he gets some rest and sleep he seems to do better.  He denies any overt chest pain.  03/25/2019  Jesse Hall returns today for follow-up of his elevated triglycerides.  I am pleased to report a significant improvement in his numbers.  His total cholesterol today is now 164, triglycerides are 505 (reduced from 1001), HDL 27 and LDL is 60.  He did undergo coronary artery calcium  scoring which was 100, this is 75th percentile based on age and sex matched controls.  Aggressive therapy is recommended given his history of diabetes as well.  His target LDL is less than 70 which she is achieved and he has had significant reduction in his triglycerides more than 50% after adjusting his medications.  He seems to be tolerating them well additionally.  He started to exercise a little more regularly walking at least a couple miles at a fast pace several days a week.  He does report he continues to be very stressed at his job.  12/02/2019  Jesse Hall is seen today in follow-up.  He had an initial excellent response to Vascepa and fenofibrate with improvement in triglycerides down to 505 however recent repeat lab work shows an increase in triglycerides now up to almost 1000 (998).  He says he is actually change his diet more significantly, reducing carbohydrates and lost 7 pounds.  Daily fasting blood sugars are better as well.  12/01/2020  Jesse Hall returns today for follow-up.  Unfortunately he has been struggling a lot at work.  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  He seemed very tired today in the office.  He says he had difficulty maintaining any kind of regular schedule including eating and working.  Unfortunately his triglycerides have gone up accordingly.  He is compliant with medications.  We had discussed starting rosuvastatin 5 mg daily previously but he never was able to start the medication.  This would be a requirement for him to enroll in the core trial.  Recent cholesterol from 2 days ago showed total 184, triglycerides 875, HDL 23 and LDL was not calculated due to high triglycerides.  His previous triglycerides were 430 but had been as high as 998 about a year ago.  05/31/2022  Jesse Hall is seen today in follow-up.  He reports that he is trying to get off of some of his cholesterol medicines.  He is lost about 30 pounds.  We  did do genetic testing as mentioned above.  He had no pathogenic variants noted.  He reports some improvement in his triglycerides with labs in October through his PCP however we are not privy to those labs at this time.  He has repeat labs coming up in 2 weeks.  He wants to wait until those labs are drawn to see what his numbers are.  01/23/2023  Jesse Hall returns today for follow-up.  He had recent lab work from his primary care provider in August.  His triglycerides have improved substantially.  Total cholesterol 132, triglycerides 241, HDL 29 and LDL 55.  Of note his creatinine is 1.8 with a GFR of 38.  Based on this he will require reduction in his fenofibrate dosing.  PMHx:  Past Medical History:  Diagnosis Date   Diabetes mellitus without complication (HCC)    High cholesterol    Hypertension    Kidney failure    Kidney stones     Past Surgical History:  Procedure Laterality Date   kidney stone removal      FAMHx:  Family History  Problem Relation Age of Onset   CAD Mother    Sleep apnea Neg Hx     SOCHx:   reports that he has never smoked. He has never used smokeless tobacco. He reports that he does not drink alcohol. No history on file for drug use.  ALLERGIES:  No Known Allergies  ROS: Pertinent items noted in HPI and remainder of comprehensive ROS otherwise negative.  HOME MEDS: Current Outpatient Medications on File Prior to Visit  Medication Sig Dispense Refill   allopurinol (ZYLOPRIM) 300 MG tablet Take 300 mg by mouth daily.     fenofibrate micronized (LOFIBRA) 134 MG capsule TAKE 1 CAPSULE BY MOUTH ONCE DAILY WITH A MEAL. 90 capsule 3   icosapent Ethyl (VASCEPA) 1 g capsule Take 2 capsules (2 g total) by mouth 2 (two) times daily. 360 capsule 3   losartan (COZAAR) 100 MG tablet Take 100 mg by mouth daily.     potassium citrate (UROCIT-K) 10 MEQ (1080 MG) SR tablet Take 10 mEq by mouth 2 (two) times daily.     rivaroxaban (XARELTO) 20 MG TABS tablet Take 1  tablet (20 mg total) by mouth daily. 90 tablet 3   rosuvastatin (CRESTOR) 5 MG tablet TAKE 1 TABLET (5 MG TOTAL) BY MOUTH DAILY. 90 tablet 3   sitaGLIPtin (JANUVIA) 100 MG tablet Take 100 mg by mouth daily.     VITAMIN D PO Take 1 tablet by mouth daily.     VITAMIN D, CHOLECALCIFEROL, PO Take by mouth daily.     ZITHROMAX Z-PAK 250 MG tablet as directed Orally     No current facility-administered medications on file prior to visit.    LABS/IMAGING: No results found for this or any previous visit (from the past 48  hour(s)). No results found.  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   Mr. Sobh has had significant improvement in his triglycerides which are now at 241.  LDL is well-controlled at less than 70 but he does have a low HDL.  I encouraged continued weight loss and aerobic exercise which should help with this.  Of note his creatinine was 1.8 with a GFR 38, based on this he needs to be on a reduced dose of fenofibrate as it may worsen the creatinine.  Will decrease that dose to 54 mg daily.  Otherwise continue his current medications.  He has follow-up with his PCP in 6 months.  Follow-up with me annually or sooner as necessary  Chrystie Nose, MD, [facility], FACP  [place]  [facility]  Medical Director of the  Advanced Lipid Disorders &  Cardiovascular Risk Reduction Clinic Diplomate of the American Board of Clinical Lipidology Attending Cardiologist  Direct Dial: [phone]  Fax: [phone]  Website:  [email or web address] 01/23/2023, 8:35 AM

## 2023-02-26 ENCOUNTER — Ambulatory Visit (HOSPITAL_COMMUNITY)
Admission: RE | Admit: 2023-02-26 | Discharge: 2023-02-26 | Disposition: A | Discharge status: Home / Self Care | Payer: BC Managed Care – PPO | Source: Ambulatory Visit | Attending: Hematology and Oncology | Admitting: Hematology and Oncology

## 2023-02-26 DIAGNOSIS — I824Z1 Acute embolism and thrombosis of unspecified deep veins of right distal lower extremity: Secondary | ICD-10-CM | POA: Insufficient documentation

## 2023-02-27 ENCOUNTER — Telehealth: Payer: Self-pay

## 2023-02-27 NOTE — Telephone Encounter (Signed)
-----   Message from Noreene Filbert sent at 02/26/2023  7:52 PM EDT ----- Patient with chronic DVT.  Recommend continuing Xarelto.  Will you call patient and see if he has any questions about this?  It is unclear to me based on Dr. Earmon Phoenix last note why he was getting this.    Thanks, LC ----- Message ----- From: Interface, [employer] Sent: 02/26/2023   1:23 PM EDT To: Serena Croissant, MD

## 2023-02-27 NOTE — Telephone Encounter (Signed)
Called pt per Np to advise pt to stay on Xarellto because of chronic DVT. Pt verbalize and understanding.

## 2023-03-27 ENCOUNTER — Other Ambulatory Visit: Payer: Self-pay | Admitting: Internal Medicine

## 2023-05-14 ENCOUNTER — Encounter (HOSPITAL_COMMUNITY): Payer: Self-pay | Admitting: Emergency Medicine

## 2023-05-14 ENCOUNTER — Ambulatory Visit (HOSPITAL_COMMUNITY)
Admission: EM | Admit: 2023-05-14 | Discharge: 2023-05-14 | Disposition: A | Discharge status: Home / Self Care | Payer: BC Managed Care – PPO | Attending: Family Medicine | Admitting: Family Medicine

## 2023-05-14 DIAGNOSIS — R252 Cramp and spasm: Secondary | ICD-10-CM | POA: Diagnosis present

## 2023-05-14 DIAGNOSIS — S40811A Abrasion of right upper arm, initial encounter: Secondary | ICD-10-CM | POA: Insufficient documentation

## 2023-05-14 LAB — BASIC METABOLIC PANEL
Anion gap: 8 (ref 5–15)
BUN: 25 mg/dL — ABNORMAL HIGH (ref 8–23)
CO2: 20 mmol/L — ABNORMAL LOW (ref 22–32)
Calcium: 8.6 mg/dL — ABNORMAL LOW (ref 8.9–10.3)
Chloride: 102 mmol/L (ref 98–111)
Creatinine, Ser: 1.9 mg/dL — ABNORMAL HIGH (ref 0.61–1.24)
GFR, Estimated: 40 mL/min — ABNORMAL LOW (ref >60)
Glucose, Bld: 118 mg/dL — ABNORMAL HIGH (ref 70–99)
Potassium: 4.5 mmol/L (ref 3.5–5.1)
Sodium: 130 mmol/L — ABNORMAL LOW (ref 135–145)

## 2023-05-14 MED ORDER — TETANUS-DIPHTH-ACELL PERTUSSIS 5-2.5-18.5 LF-MCG/0.5 IM SUSY
0.5000 mL | PREFILLED_SYRINGE | Freq: Once | INTRAMUSCULAR | Status: AC
  Administered 2023-05-14: 0.5 mL via INTRAMUSCULAR

## 2023-05-14 MED ORDER — TETANUS-DIPHTH-ACELL PERTUSSIS 5-2.5-18.5 LF-MCG/0.5 IM SUSY
PREFILLED_SYRINGE | INTRAMUSCULAR | Status: AC
  Filled 2023-05-14: qty 0.5

## 2023-05-14 NOTE — ED Triage Notes (Signed)
 Pt having abd cramping that started this morning. Around noon started having cramps all over. Drank a lot of water but cramps keep coming back. Pt reports that has medication that can cause cramps and normally will hydrate and will help.  Reports did help tear down an old house Friday night. Took couple tylenol around noon, unable to tell if helped.

## 2023-05-14 NOTE — Discharge Instructions (Signed)
 We have drawn blood to check your sodium and potassium and kidney function numbers.  If there is significant abnormality we will call you.  You have been given a Tdap vaccination to boost your tetanus immunity

## 2023-05-14 NOTE — ED Provider Notes (Signed)
 [facility]    CSN: [id number] Arrival date & time: 05/14/23  1605      History   Chief Complaint Chief Complaint  Patient presents with   Abdominal Cramping   Muscle Pain    HPI Jesse Hall is a 62 y.o. male.    Abdominal Cramping  Muscle Pain   Here for muscle cramping that began today.  He did have some cough and mild congestion that began on December 27 but that is already improved.  Also around December 27 and 28 he had loose watery stools that were frequent.  No nausea or vomiting, but he has had decreased appetite.  No fever or chills  He did help care down the house on December 27 when he did do a little extra perspiring that day.  He also sustained an abrasion to his right upper arm and is concerned that his tetanus is out of date. 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 Authorizing Provider  allopurinol (ZYLOPRIM) 300 MG tablet Take 300 mg by mouth daily.    [provider]  fenofibrate  54 MG tablet Take 1 tablet (54 mg total) by mouth daily. 01/23/23   Hilty, Vinie BROCKS, MD  icosapent  Ethyl (VASCEPA ) 1 g capsule Take 2 capsules (2 g total) by mouth 2 (two) times daily. 10/22/22   Hilty, Vinie BROCKS, MD  losartan (COZAAR) 100 MG tablet Take 100 mg by mouth daily.    [provider]  potassium citrate (UROCIT-K) 10 MEQ (1080 MG) SR  tablet Take 10 mEq by mouth 2 (two) times daily.    [provider]  rivaroxaban  (XARELTO ) 20 MG TABS tablet Take 1 tablet (20 mg total) by mouth daily. 12/06/22   Gudena, Vinay, MD  rosuvastatin  (CRESTOR ) 5 MG tablet TAKE 1 TABLET (5 MG TOTAL) BY MOUTH DAILY. 11/27/22   Hilty, Vinie BROCKS, MD  sitaGLIPtin (JANUVIA) 100 MG tablet Take 100 mg by mouth daily.    [provider]  VITAMIN D PO Take 1 tablet by mouth daily.    [provider]  VITAMIN D, CHOLECALCIFEROL, PO Take by mouth daily.    [provider]  ZITHROMAX Z-PAK 250 MG tablet as directed Orally 10/15/22   [provider]    Family History Family History  Problem Relation Age of Onset   CAD Mother    Sleep apnea Neg Hx     Social History Social History   Tobacco Use   Smoking status: Never   Smokeless tobacco: Never  Substance Use Topics   Alcohol use: No     Allergies   Patient has no known allergies.   Review of Systems Review of Systems   Physical Exam Triage Vital Signs ED Triage Vitals  Encounter Vitals Group     BP 05/14/23 1746 (!) 160/77  Systolic BP Percentile --      Diastolic BP Percentile --      Pulse Rate 05/14/23 1746 76     Resp 05/14/23 1746 18     Temp 05/14/23 1746 (!) 97.5 F (36.4 C)     Temp Source 05/14/23 1746 Oral     SpO2 05/14/23 1746 96 %     Weight --      Height --      Head Circumference --      Peak Flow --      Pain Score 05/14/23 1744 4     Pain Loc --      Pain Education --      Exclude from Growth Chart --    No data found.  Updated Vital Signs BP (!) 160/77   Pulse 76   Temp (!) 97.5 F (36.4 C) (Oral)   Resp 18   SpO2 96%   Visual Acuity Right Eye Distance:   Left Eye Distance:   Bilateral Distance:    Right Eye Near:   Left Eye Near:    Bilateral Near:     Physical Exam Vitals reviewed.  Constitutional:      General: He is not in acute distress.    Appearance: He is not ill-appearing,  toxic-appearing or diaphoretic.  HENT:     Nose: Nose normal.     Mouth/Throat:     Mouth: Mucous membranes are moist.  Eyes:     Extraocular Movements: Extraocular movements intact.     Conjunctiva/sclera: Conjunctivae normal.     Pupils: Pupils are equal, round, and reactive to light.  Cardiovascular:     Rate and Rhythm: Normal rate and regular rhythm.     Heart sounds: No murmur heard. Pulmonary:     Effort: Pulmonary effort is normal. No respiratory distress.     Breath sounds: Normal breath sounds. No stridor. No wheezing, rhonchi or rales.  Abdominal:     Palpations: Abdomen is soft.     Tenderness: There is no abdominal tenderness.  Musculoskeletal:     Cervical back: Neck supple.  Lymphadenopathy:     Cervical: No cervical adenopathy.  Skin:    Coloration: Skin is not jaundiced or pale.     Comments: There is a shallow linear abrasion on the posterior right upper arm.  Neurological:     General: No focal deficit present.     Mental Status: He is alert and oriented to person, place, and time.  Psychiatric:        Behavior: Behavior normal.      UC Treatments / Results  Labs (all labs ordered are listed, but only abnormal results are displayed) Labs Reviewed  BASIC METABOLIC PANEL    EKG   Radiology No results found.  Procedures Procedures (including critical care time)  Medications Ordered in UC Medications  Tdap (BOOSTRIX ) injection 0.5 mL (has no administration in time range)    Initial Impression / Assessment and Plan / UC Course  I have reviewed the triage vital signs and the nursing notes.  Pertinent labs & imaging results that were available during my care of the patient were reviewed by me and considered in my medical decision making (see chart for details).     Tdap is given to boost his tetanus immunity.  BMP is drawn and we will notify him if anything is significantly abnormal.  Since his respiratory symptoms are already improving,  monitor doing a viral swabs Final Clinical Impressions(s) /  UC Diagnoses   Final diagnoses:  Muscle cramps  Abrasion of right upper extremity, initial encounter     Discharge Instructions      We have drawn blood to check your sodium and potassium and kidney function numbers.  If there is significant abnormality we will call you.  You have been given a Tdap vaccination to boost your tetanus immunity     ED Prescriptions   None    PDMP not reviewed this encounter.   Vonna Sharlet POUR, MD 05/14/23 [phone]

## 2023-06-15 IMAGING — US US RENAL
1 series · 14 of 25 positions shown · non-contrast
Comparison: CT abdomen pelvis 08/01/2015

CLINICAL DATA: Stage 3 chronic kidney disease

EXAM:
RENAL / URINARY TRACT ULTRASOUND COMPLETE

[Series 1: us renal · 14 of 54 slices shown]
[im 1/54]
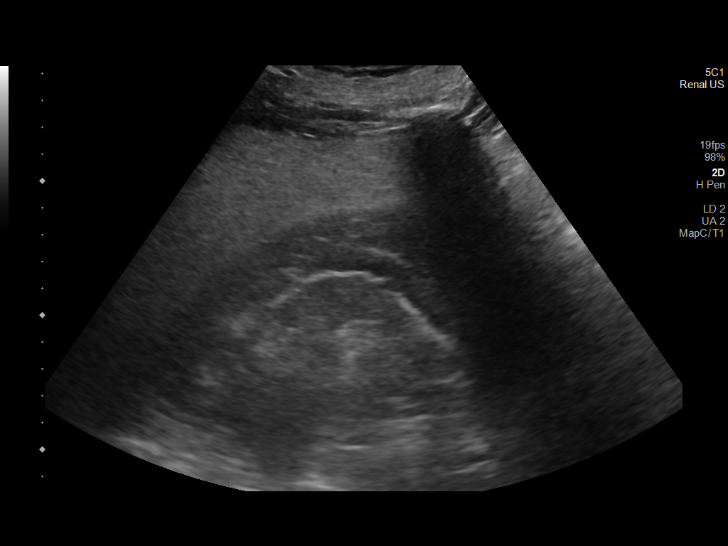
[im 5/54]
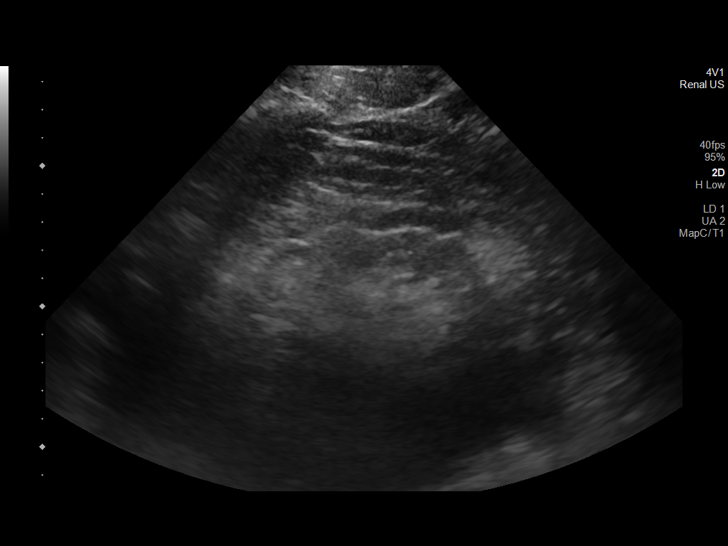
[im 9/54]
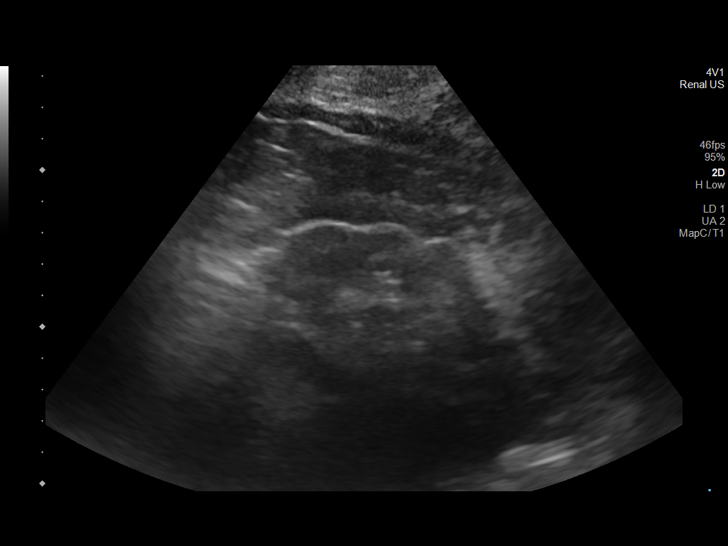
[im 14/54]
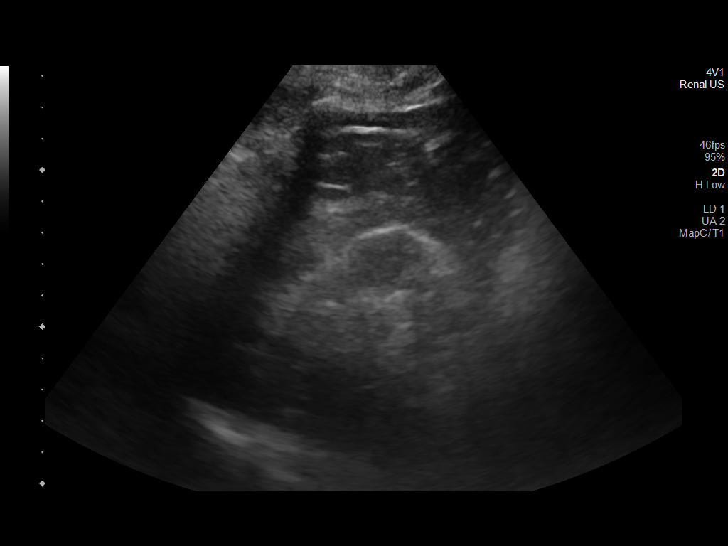
[im 18/54]
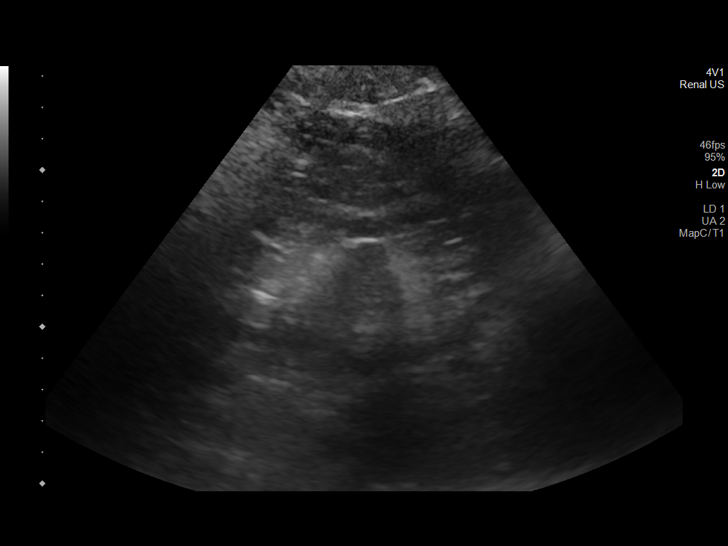
[im 20/54]
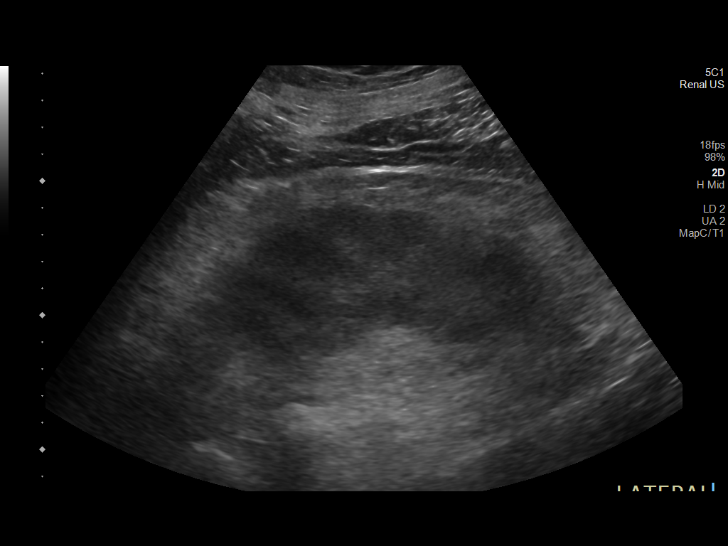
[im 25/54]
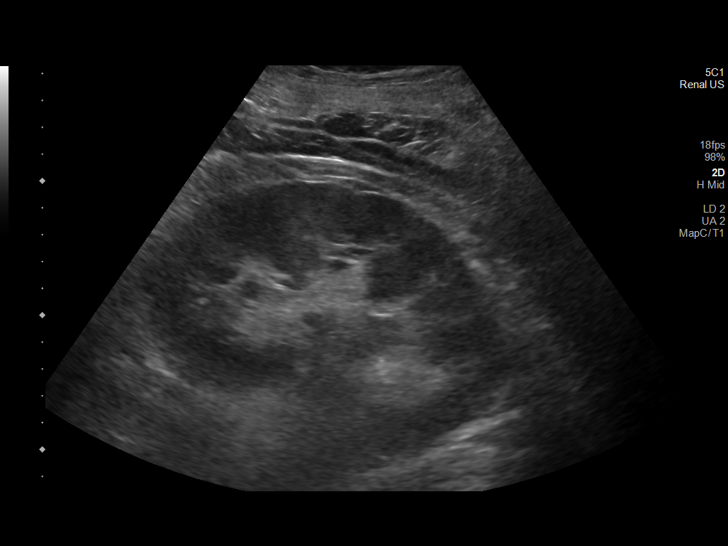
[im 29/54]
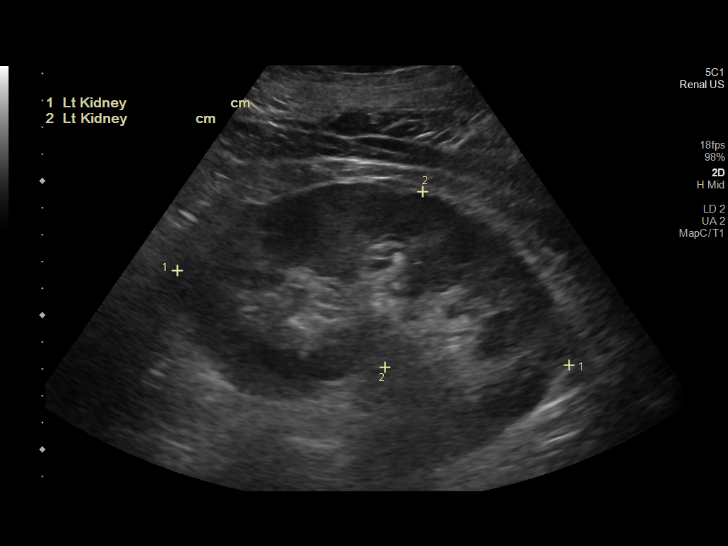
[im 34/54]
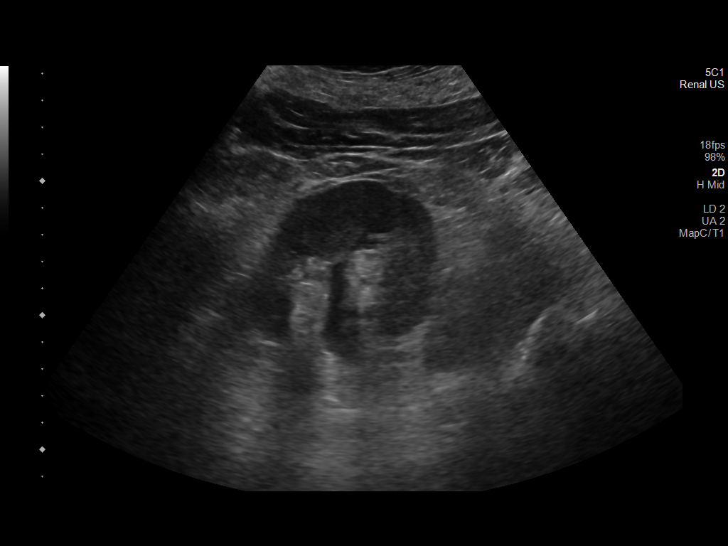
[im 36/54]
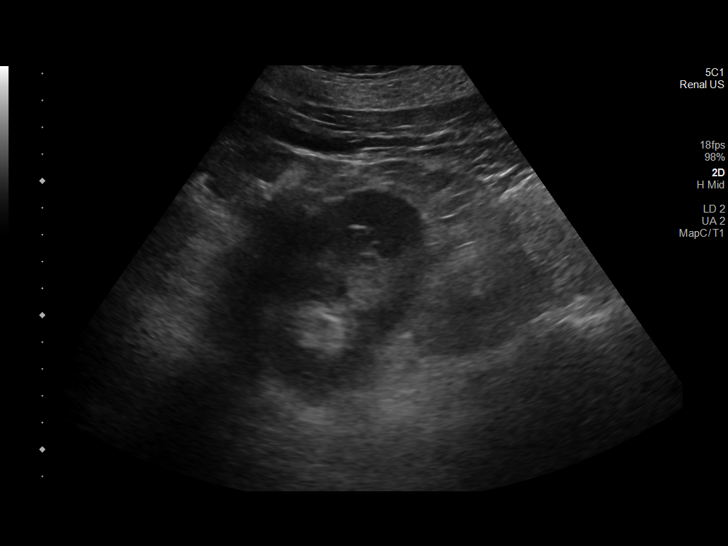
[im 40/54]
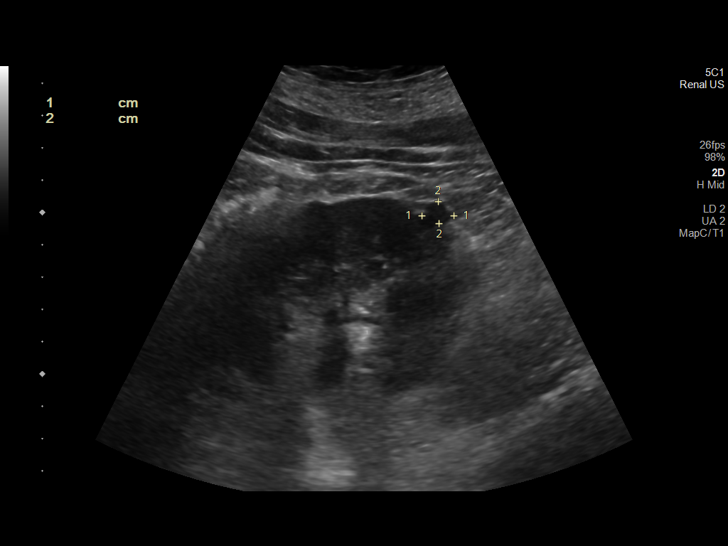
[im 45/54]
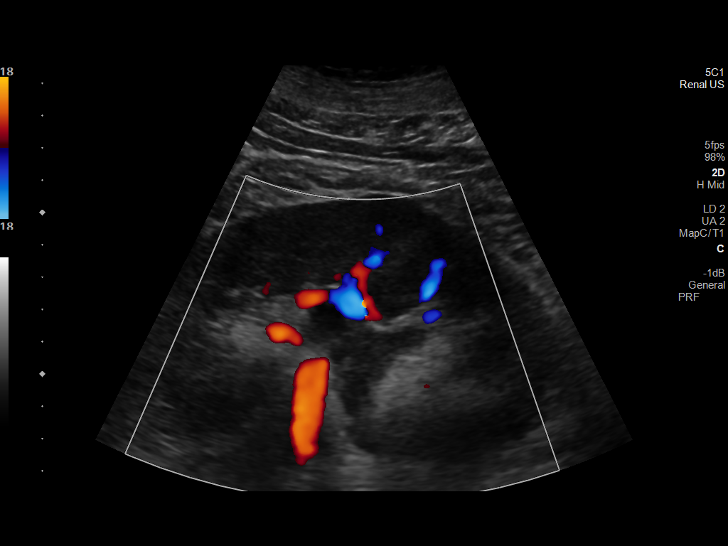
[im 49/54]
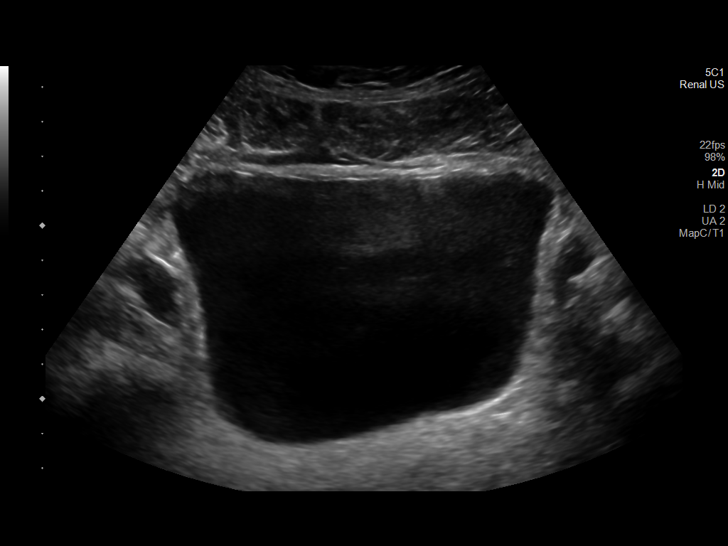
[im 54/54]
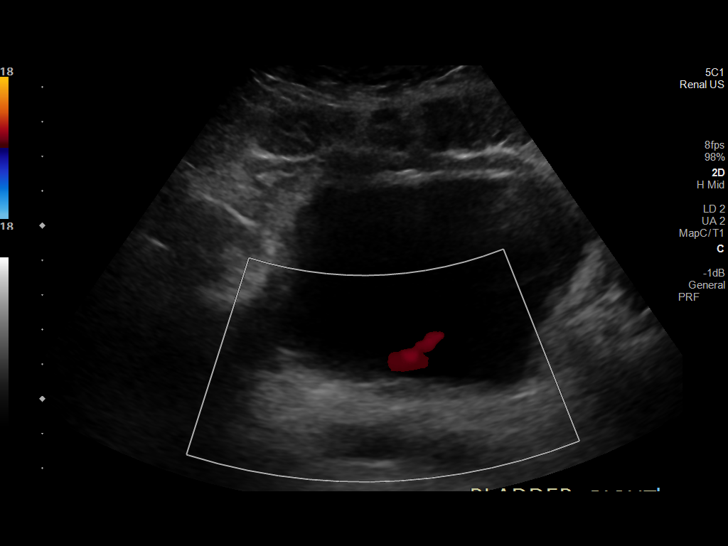

[14 of 25 positions shown; findings below may reference images not displayed]

FINDINGS: Right Kidney:

Renal measurements: 8.7 x 4.0 x 3.7 cm = volume: 68 mL. Mild
increased echogenicity of the renal cortex without significant
thinning. No mass or hydronephrosis visualized.

Left Kidney:

Renal measurements: 15.0 x 6.7 x 6.4 cm = volume: 338 mL.
Echogenicity within normal limits. No mass or hydronephrosis
visualized. 1.0 cm exophytic simple cyst seen in the mid kidney.

Bladder:

Appears normal for degree of bladder distention.

Other:

None.
IMPRESSION: Asymmetrically atrophied right kidney again seen. No significant
abnormality of the left kidney.

## 2023-10-26 ENCOUNTER — Other Ambulatory Visit: Payer: Self-pay | Admitting: Internal Medicine

## 2023-12-10 ENCOUNTER — Inpatient Hospital Stay: Payer: BC Managed Care – PPO | Attending: Hematology and Oncology | Admitting: Hematology and Oncology

## 2023-12-10 DIAGNOSIS — I824Z1 Acute embolism and thrombosis of unspecified deep veins of right distal lower extremity: Secondary | ICD-10-CM | POA: Diagnosis not present

## 2023-12-10 MED ORDER — RIVAROXABAN 20 MG PO TABS: 20.0000 mg | ORAL_TABLET | Freq: Every day | ORAL | 3 refills | Status: AC

## 2023-12-10 NOTE — Progress Notes (Signed)
 HEMATOLOGY-ONCOLOGY TELEPHONE VISIT PROGRESS NOTE  I connected with our patient on 12/10/23 at  8:15 AM EDT by telephone and verified that I am speaking with the correct person using two identifiers.  I discussed the limitations, risks, security and privacy concerns of performing an evaluation and management service by telephone and the availability of in person appointments.  I also discussed with the patient that there may be a patient responsible charge related to this service. The patient expressed understanding and agreed to proceed.   History of Present Illness:   History of Present Illness Jesse Hall is a 63 year old male with chronic DVT who presents with leg swelling and hemochromatosis diagnosis. He was referred by Dr. Glendia Running for evaluation of hemochromatosis.  He has a diagnosis of hemochromatosis, confirmed through genetic testing, despite no known family history. His ferritin level was 965 in September 2021, with an iron saturation of 12%. His sisters tested negative for the condition.  He experiences occasional swelling in his right leg, particularly when standing for extended periods, due to chronic DVT. He is on Xarelto  without issues. An ultrasound in October 2024 showed partial recanalization of four veins previously blocked by a clot extending from his hip to his ankle. He remains on anticoagulation therapy.  REVIEW OF SYSTEMS:   Constitutional: Denies fevers, chills or abnormal weight loss All other systems were reviewed with the patient and are negative. Observations/Objective:     Assessment Plan:  Lower leg DVT (deep venous thromboembolism), acute, right [facility]) Patient was complaining of left leg pain and ultrasound of the leg revealed extensive DVT involving the femoral-popliteal gastrocnemius and soleus veins   Lupus anticoagulant test: Positive Repeat testing: Negative  Covid inf: May had lots of side effects incl brain fog.   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   Ultrasound lower extremity 02/23/2022: Chronic DVT right femoral vein and right popliteal vein Ultrasound lower extremity 02/26/2023: Chronic DVT involving the right femoral vein, right popliteal vein, right posterior tibial veins, chronic SVT right greater saphenous vein and right small saphenous vein   Duration of anticoagulation: Long-term --------------------------------- Assessment and Plan Assessment & Plan Chronic right lower extremity deep vein thrombosis (DVT) Chronic DVT extends from the right femoral vein to the popliteal and tibial veins. October 2024 ultrasound shows clot remains but with improved blood flow in four veins. Symptoms managed with occasional swelling. Clot may not fully dissolve due to potential vein scarring. - Continue Rivaroxaban  20 MG oral daily. - Monitor symptoms; consider ultrasound if symptoms worsen.  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 No iron overload or active hemochromatosis. Low iron saturation; past ferritin elevation likely due to inflammation. Single H63D mutation not expected to affect health significantly. - Request genetic test report from Dr. Glendia Hamilton office. - Monitor iron levels annually with primary care physician.      I discussed the assessment and treatment plan with the patient. The patient was provided an opportunity to ask questions and all were answered. The patient agreed with the plan and demonstrated an understanding of the instructions. The patient was advised to call back or seek an in-person evaluation if the symptoms worsen or if the condition fails to improve as anticipated.   I provided 20 minutes of non-face-to-face time during this encounter.  This includes time for charting  and coordination of care  Viinay K Markham Dumlao, MD

## 2023-12-10 NOTE — Assessment & Plan Note (Signed)
 Patient was complaining of left leg pain and ultrasound of the leg revealed extensive DVT involving the femoral-popliteal gastrocnemius and soleus veins   Lupus anticoagulant test: Positive Repeat testing: Negative  Covid inf: May had lots of side effects incl brain fog.   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   Ultrasound lower extremity 02/23/2022: Chronic DVT right femoral vein and right popliteal vein Ultrasound lower extremity 02/26/2023: Chronic DVT involving the right femoral vein, right popliteal vein, right posterior tibial veins, chronic SVT right greater saphenous vein and right small saphenous vein   Duration of anticoagulation: Long-term

## 2024-01-22 ENCOUNTER — Encounter: Payer: Self-pay | Admitting: Internal Medicine

## 2024-01-25 ENCOUNTER — Other Ambulatory Visit: Payer: Self-pay | Admitting: Internal Medicine

## 2024-02-20 ENCOUNTER — Other Ambulatory Visit: Payer: Self-pay | Admitting: Internal Medicine

## 2024-03-04 ENCOUNTER — Encounter: Payer: Self-pay | Admitting: Internal Medicine

## 2024-03-06 ENCOUNTER — Other Ambulatory Visit: Payer: Self-pay | Admitting: Internal Medicine

## 2024-03-17 ENCOUNTER — Other Ambulatory Visit: Payer: Self-pay | Admitting: Internal Medicine

## 2024-04-23 ENCOUNTER — Other Ambulatory Visit: Payer: Self-pay | Admitting: Internal Medicine

## 2024-05-22 ENCOUNTER — Other Ambulatory Visit: Payer: Self-pay | Admitting: Internal Medicine

## 2024-08-28 ENCOUNTER — Ambulatory Visit: Admitting: Internal Medicine
